# Patient Record
Sex: Female | Born: 1955 | Race: Black or African American | Hispanic: No | Marital: Single | Smoking: Former smoker
Health system: Southern US, Community
[De-identification: ages and names within clinical notes are randomized; demographics above are authoritative.]

## PROBLEM LIST (undated history)

## (undated) DIAGNOSIS — B182 Chronic viral hepatitis C: Secondary | ICD-10-CM

## (undated) DIAGNOSIS — F329 Major depressive disorder, single episode, unspecified: Secondary | ICD-10-CM

## (undated) DIAGNOSIS — I7090 Unspecified atherosclerosis: Secondary | ICD-10-CM

## (undated) DIAGNOSIS — I10 Essential (primary) hypertension: Secondary | ICD-10-CM

## (undated) DIAGNOSIS — D649 Anemia, unspecified: Secondary | ICD-10-CM

## (undated) DIAGNOSIS — F1910 Other psychoactive substance abuse, uncomplicated: Secondary | ICD-10-CM

## (undated) DIAGNOSIS — K7460 Unspecified cirrhosis of liver: Secondary | ICD-10-CM

## (undated) DIAGNOSIS — B1920 Unspecified viral hepatitis C without hepatic coma: Secondary | ICD-10-CM

## (undated) DIAGNOSIS — F419 Anxiety disorder, unspecified: Secondary | ICD-10-CM

## (undated) DIAGNOSIS — S82899A Other fracture of unspecified lower leg, initial encounter for closed fracture: Secondary | ICD-10-CM

## (undated) DIAGNOSIS — F32A Depression, unspecified: Secondary | ICD-10-CM

## (undated) DIAGNOSIS — M1990 Unspecified osteoarthritis, unspecified site: Secondary | ICD-10-CM

## (undated) DIAGNOSIS — R002 Palpitations: Secondary | ICD-10-CM

## (undated) DIAGNOSIS — E1140 Type 2 diabetes mellitus with diabetic neuropathy, unspecified: Secondary | ICD-10-CM

## (undated) DIAGNOSIS — J40 Bronchitis, not specified as acute or chronic: Secondary | ICD-10-CM

## (undated) DIAGNOSIS — K219 Gastro-esophageal reflux disease without esophagitis: Secondary | ICD-10-CM

## (undated) HISTORY — PX: TUBAL LIGATION: SHX77

## (undated) HISTORY — DX: Chronic viral hepatitis C: B18.2

## (undated) HISTORY — DX: Unspecified atherosclerosis: I70.90

## (undated) HISTORY — DX: Anemia, unspecified: D64.9

## (undated) HISTORY — DX: Other psychoactive substance abuse, uncomplicated: F19.10

## (undated) HISTORY — DX: Other fracture of unspecified lower leg, initial encounter for closed fracture: S82.899A

## (undated) HISTORY — DX: Unspecified cirrhosis of liver: K74.60

## (undated) HISTORY — DX: Unspecified viral hepatitis C without hepatic coma: B19.20

## (undated) HISTORY — PX: CERVICAL BIOPSY: SHX590

## (undated) HISTORY — PX: TONSILLECTOMY: SUR1361

---

## 1998-07-17 ENCOUNTER — Inpatient Hospital Stay (HOSPITAL_COMMUNITY): Admission: AD | Admit: 1998-07-17 | Discharge: 1998-07-22 | Payer: Self-pay | Admitting: *Deleted

## 1998-11-08 ENCOUNTER — Emergency Department (HOSPITAL_COMMUNITY): Admission: EM | Admit: 1998-11-08 | Discharge: 1998-11-08 | Payer: Self-pay | Admitting: Emergency Medicine

## 1998-11-09 ENCOUNTER — Ambulatory Visit (HOSPITAL_COMMUNITY): Admission: RE | Admit: 1998-11-09 | Discharge: 1998-11-09 | Payer: Self-pay | Admitting: Emergency Medicine

## 1998-11-09 ENCOUNTER — Encounter: Payer: Self-pay | Admitting: Emergency Medicine

## 1998-11-09 ENCOUNTER — Emergency Department (HOSPITAL_COMMUNITY): Admission: EM | Admit: 1998-11-09 | Discharge: 1998-11-09 | Payer: Self-pay | Admitting: Emergency Medicine

## 1998-11-19 ENCOUNTER — Other Ambulatory Visit: Admission: RE | Admit: 1998-11-19 | Discharge: 1998-11-19 | Payer: Self-pay | Admitting: *Deleted

## 1998-11-19 ENCOUNTER — Encounter: Admission: RE | Admit: 1998-11-19 | Discharge: 1998-11-19 | Payer: Self-pay | Admitting: Obstetrics

## 1999-02-01 ENCOUNTER — Encounter: Payer: Self-pay | Admitting: Internal Medicine

## 1999-02-01 ENCOUNTER — Ambulatory Visit (HOSPITAL_COMMUNITY): Admission: RE | Admit: 1999-02-01 | Discharge: 1999-02-01 | Payer: Self-pay | Admitting: Internal Medicine

## 1999-02-04 ENCOUNTER — Emergency Department (HOSPITAL_COMMUNITY): Admission: EM | Admit: 1999-02-04 | Discharge: 1999-02-04 | Payer: Self-pay

## 1999-02-09 ENCOUNTER — Encounter: Admission: RE | Admit: 1999-02-09 | Discharge: 1999-02-09 | Payer: Self-pay | Admitting: Obstetrics & Gynecology

## 2004-03-04 ENCOUNTER — Ambulatory Visit: Payer: Self-pay | Admitting: Family Medicine

## 2004-03-16 ENCOUNTER — Ambulatory Visit: Payer: Self-pay | Admitting: Family Medicine

## 2004-03-19 ENCOUNTER — Ambulatory Visit: Payer: Self-pay | Admitting: *Deleted

## 2004-04-05 ENCOUNTER — Ambulatory Visit: Payer: Self-pay | Admitting: Family Medicine

## 2004-06-01 ENCOUNTER — Ambulatory Visit: Payer: Self-pay | Admitting: Family Medicine

## 2004-06-19 ENCOUNTER — Emergency Department (HOSPITAL_COMMUNITY): Admission: EM | Admit: 2004-06-19 | Discharge: 2004-06-19 | Payer: Self-pay | Admitting: Emergency Medicine

## 2004-06-21 ENCOUNTER — Ambulatory Visit: Payer: Self-pay | Admitting: Family Medicine

## 2004-06-21 LAB — CONVERTED CEMR LAB: Pap Smear: NORMAL

## 2004-06-30 ENCOUNTER — Ambulatory Visit (HOSPITAL_COMMUNITY): Admission: RE | Admit: 2004-06-30 | Discharge: 2004-06-30 | Payer: Self-pay | Admitting: Family Medicine

## 2004-08-09 ENCOUNTER — Ambulatory Visit: Payer: Self-pay | Admitting: Family Medicine

## 2004-10-04 ENCOUNTER — Ambulatory Visit: Payer: Self-pay | Admitting: Family Medicine

## 2005-01-05 ENCOUNTER — Ambulatory Visit: Payer: Self-pay | Admitting: Internal Medicine

## 2005-01-13 ENCOUNTER — Ambulatory Visit: Payer: Self-pay | Admitting: Internal Medicine

## 2005-05-20 ENCOUNTER — Ambulatory Visit: Payer: Self-pay | Admitting: Family Medicine

## 2005-05-24 ENCOUNTER — Ambulatory Visit (HOSPITAL_COMMUNITY): Admission: RE | Admit: 2005-05-24 | Discharge: 2005-05-24 | Payer: Self-pay | Admitting: Internal Medicine

## 2005-06-29 ENCOUNTER — Ambulatory Visit: Payer: Self-pay | Admitting: Family Medicine

## 2005-10-21 ENCOUNTER — Ambulatory Visit: Payer: Self-pay | Admitting: Family Medicine

## 2005-11-09 IMAGING — CR DG LUMBAR SPINE COMPLETE 4+V
5 series · 5 of 5 positions shown · non-contrast
Comparison: none

CLINICAL DATA: Chronic lower back pain.  
LUMBOSACRAL SPINE SERIES:
There are five lumbar type vertebral bodies identified.  Vertebral body heights are maintained.  There is mild disc space narrowing identified at the L3-4 level with associate anterior osteophytosis at L3-4 level compatible with mild degenerative change.  Bony alignment appears intact.  No evidence for spondylosis or spondylolisthesis is seen.  No acute bony abnormalities are noted.  Incidental note is made of floculant calcification in the right aspect of the pelvis compatible with fibroid calcification.

[view not recorded (1 of 5)]
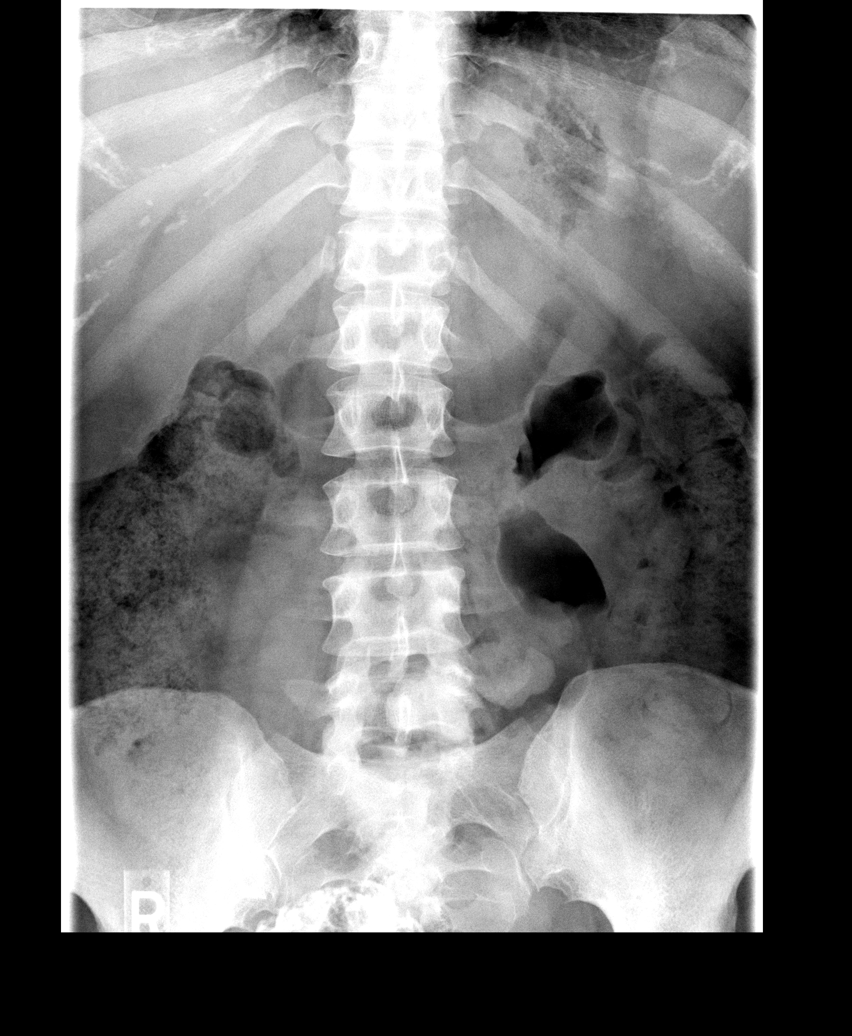

[view not recorded (2 of 5)]
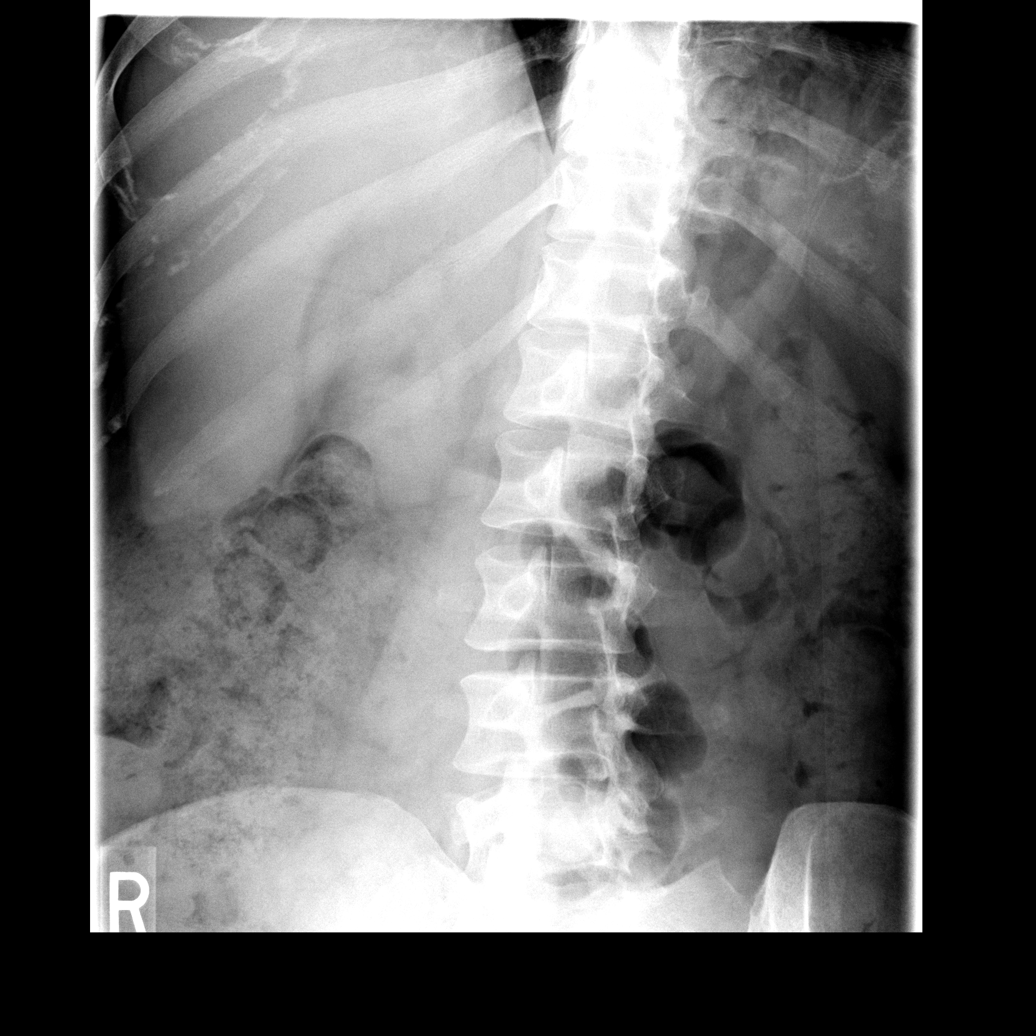

[view not recorded (3 of 5)]
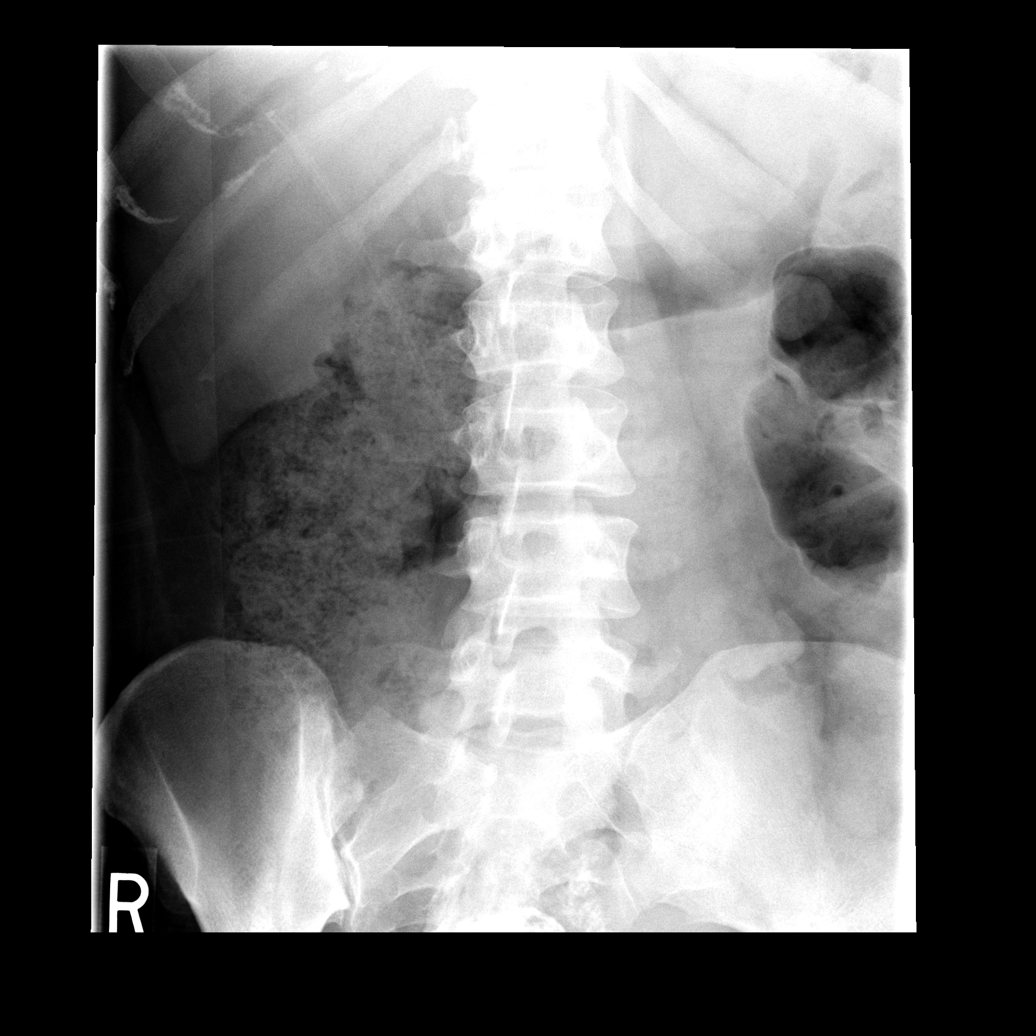

[view not recorded (4 of 5)]
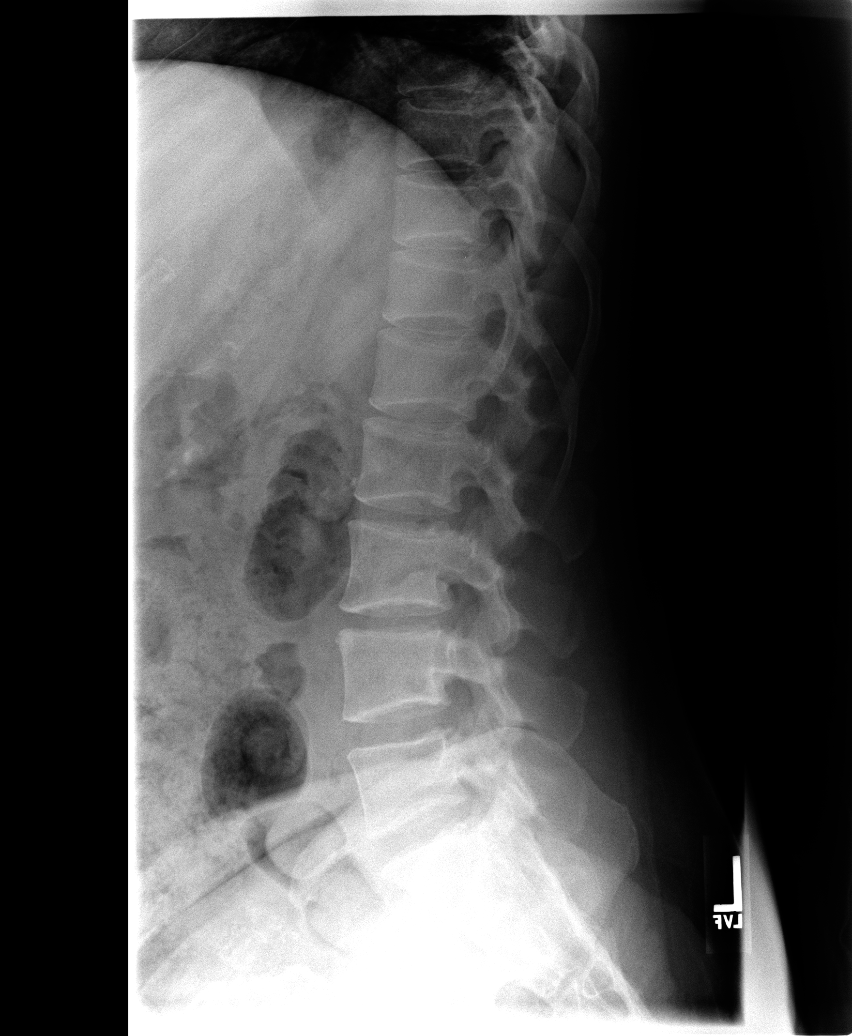

[view not recorded (5 of 5)]
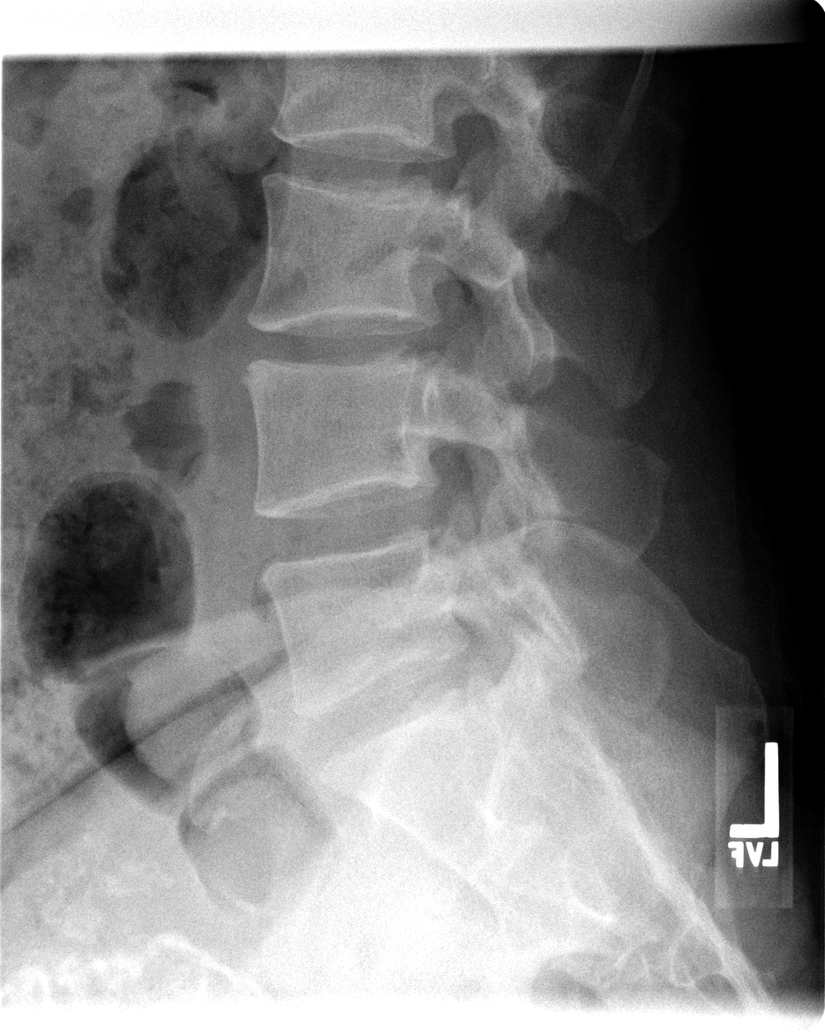

[5 of 5 positions shown; findings below may reference images not displayed]

IMPRESSION: Findings compatible with mild degenerative changes as described above.  Fibroid calcification.

## 2005-11-22 ENCOUNTER — Ambulatory Visit: Payer: Self-pay | Admitting: Internal Medicine

## 2005-12-02 ENCOUNTER — Emergency Department (HOSPITAL_COMMUNITY): Admission: EM | Admit: 2005-12-02 | Discharge: 2005-12-02 | Payer: Self-pay | Admitting: Emergency Medicine

## 2005-12-16 ENCOUNTER — Ambulatory Visit: Payer: Self-pay | Admitting: Family Medicine

## 2005-12-21 ENCOUNTER — Ambulatory Visit: Payer: Self-pay | Admitting: Family Medicine

## 2006-03-14 ENCOUNTER — Ambulatory Visit: Payer: Self-pay | Admitting: Internal Medicine

## 2006-05-04 ENCOUNTER — Emergency Department (HOSPITAL_COMMUNITY): Admission: EM | Admit: 2006-05-04 | Discharge: 2006-05-05 | Payer: Self-pay | Admitting: Emergency Medicine

## 2006-05-29 ENCOUNTER — Ambulatory Visit: Payer: Self-pay | Admitting: Family Medicine

## 2006-09-21 ENCOUNTER — Ambulatory Visit: Payer: Self-pay | Admitting: Family Medicine

## 2006-10-23 ENCOUNTER — Ambulatory Visit: Payer: Self-pay | Admitting: Family Medicine

## 2006-11-14 ENCOUNTER — Ambulatory Visit: Payer: Self-pay | Admitting: Family Medicine

## 2006-12-26 ENCOUNTER — Encounter (INDEPENDENT_AMBULATORY_CARE_PROVIDER_SITE_OTHER): Payer: Self-pay | Admitting: Family Medicine

## 2006-12-26 DIAGNOSIS — G609 Hereditary and idiopathic neuropathy, unspecified: Secondary | ICD-10-CM | POA: Insufficient documentation

## 2006-12-26 DIAGNOSIS — B1710 Acute hepatitis C without hepatic coma: Secondary | ICD-10-CM

## 2006-12-26 DIAGNOSIS — I10 Essential (primary) hypertension: Secondary | ICD-10-CM | POA: Insufficient documentation

## 2006-12-26 DIAGNOSIS — E119 Type 2 diabetes mellitus without complications: Secondary | ICD-10-CM

## 2007-01-04 ENCOUNTER — Ambulatory Visit: Payer: Self-pay | Admitting: Internal Medicine

## 2007-02-15 ENCOUNTER — Encounter (INDEPENDENT_AMBULATORY_CARE_PROVIDER_SITE_OTHER): Payer: Self-pay | Admitting: Internal Medicine

## 2007-02-15 LAB — CONVERTED CEMR LAB
ALT: 15 units/L (ref 0–35)
AST: 18 units/L (ref 0–37)
CO2: 22 meq/L (ref 19–32)
Calcium: 9 mg/dL (ref 8.4–10.5)
Chloride: 108 meq/L (ref 96–112)
Cholesterol: 204 mg/dL — ABNORMAL HIGH (ref 0–200)
Creatinine, Ser: 1.01 mg/dL (ref 0.40–1.20)
Potassium: 3.4 meq/L — ABNORMAL LOW (ref 3.5–5.3)
Sodium: 143 meq/L (ref 135–145)
Total CHOL/HDL Ratio: 5
Total Protein: 7.7 g/dL (ref 6.0–8.3)

## 2007-02-28 ENCOUNTER — Encounter (INDEPENDENT_AMBULATORY_CARE_PROVIDER_SITE_OTHER): Payer: Self-pay | Admitting: *Deleted

## 2007-08-24 ENCOUNTER — Encounter: Admission: RE | Admit: 2007-08-24 | Discharge: 2007-08-24 | Payer: Self-pay | Admitting: Gastroenterology

## 2007-11-08 ENCOUNTER — Ambulatory Visit: Payer: Self-pay | Admitting: Internal Medicine

## 2007-12-04 ENCOUNTER — Encounter (INDEPENDENT_AMBULATORY_CARE_PROVIDER_SITE_OTHER): Payer: Self-pay | Admitting: Family Medicine

## 2007-12-04 ENCOUNTER — Ambulatory Visit: Payer: Self-pay | Admitting: Internal Medicine

## 2007-12-04 ENCOUNTER — Encounter: Payer: Self-pay | Admitting: Family Medicine

## 2007-12-04 LAB — CONVERTED CEMR LAB
AST: 60 units/L — ABNORMAL HIGH (ref 0–37)
Albumin: 3.9 g/dL (ref 3.5–5.2)
Alkaline Phosphatase: 64 units/L (ref 39–117)
Calcium: 9.3 mg/dL (ref 8.4–10.5)
Chloride: 109 meq/L (ref 96–112)
Glucose, Bld: 82 mg/dL (ref 70–99)
LDL Cholesterol: 73 mg/dL (ref 0–99)
Potassium: 4.1 meq/L (ref 3.5–5.3)
Sodium: 143 meq/L (ref 135–145)
Total Protein: 7.2 g/dL (ref 6.0–8.3)

## 2008-01-15 ENCOUNTER — Ambulatory Visit: Payer: Self-pay | Admitting: Internal Medicine

## 2008-03-10 ENCOUNTER — Ambulatory Visit: Payer: Self-pay | Admitting: Internal Medicine

## 2008-03-11 ENCOUNTER — Ambulatory Visit: Payer: Self-pay | Admitting: Internal Medicine

## 2008-06-11 ENCOUNTER — Ambulatory Visit: Payer: Self-pay | Admitting: Internal Medicine

## 2008-10-21 ENCOUNTER — Emergency Department (HOSPITAL_COMMUNITY): Admission: EM | Admit: 2008-10-21 | Discharge: 2008-10-21 | Payer: Self-pay | Admitting: Emergency Medicine

## 2009-01-25 ENCOUNTER — Emergency Department (HOSPITAL_COMMUNITY): Admission: EM | Admit: 2009-01-25 | Discharge: 2009-01-25 | Payer: Self-pay | Admitting: Emergency Medicine

## 2009-01-27 ENCOUNTER — Ambulatory Visit: Payer: Self-pay | Admitting: Internal Medicine

## 2009-01-27 ENCOUNTER — Encounter (INDEPENDENT_AMBULATORY_CARE_PROVIDER_SITE_OTHER): Payer: Self-pay | Admitting: Adult Health

## 2009-01-27 LAB — CONVERTED CEMR LAB
AST: 53 units/L — ABNORMAL HIGH (ref 0–37)
Albumin: 3.8 g/dL (ref 3.5–5.2)
BUN: 10 mg/dL (ref 6–23)
CO2: 22 meq/L (ref 19–32)
Calcium: 9.1 mg/dL (ref 8.4–10.5)
Chloride: 106 meq/L (ref 96–112)
Cholesterol: 137 mg/dL (ref 0–200)
Eosinophils Relative: 1 % (ref 0–5)
Glucose, Bld: 128 mg/dL — ABNORMAL HIGH (ref 70–99)
HCT: 39.5 % (ref 36.0–46.0)
HDL: 32 mg/dL — ABNORMAL LOW (ref >39)
Hemoglobin: 12.8 g/dL (ref 12.0–15.0)
Lymphocytes Relative: 38 % (ref 12–46)
Lymphs Abs: 1.6 10*3/uL (ref 0.7–4.0)
MCV: 81.8 fL (ref 78.0–100.0)
Monocytes Absolute: 0.5 10*3/uL (ref 0.1–1.0)
Monocytes Relative: 11 % (ref 3–12)
Potassium: 3.8 meq/L (ref 3.5–5.3)
TSH: 0.888 microintl units/mL (ref 0.350–4.500)
Triglycerides: 150 mg/dL — ABNORMAL HIGH (ref <150)
WBC: 4.1 10*3/uL (ref 4.0–10.5)

## 2009-02-25 ENCOUNTER — Ambulatory Visit: Payer: Self-pay | Admitting: Family Medicine

## 2009-02-25 LAB — CONVERTED CEMR LAB
Basophils Relative: 0 % (ref 0–1)
Hemoglobin: 12.1 g/dL (ref 12.0–15.0)
MCHC: 31.1 g/dL (ref 30.0–36.0)
Monocytes Absolute: 0.5 10*3/uL (ref 0.1–1.0)
Monocytes Relative: 15 % — ABNORMAL HIGH (ref 3–12)
Neutro Abs: 1.4 10*3/uL — ABNORMAL LOW (ref 1.7–7.7)
RBC: 4.6 M/uL (ref 3.87–5.11)

## 2010-07-04 ENCOUNTER — Encounter: Payer: Self-pay | Admitting: Family Medicine

## 2010-09-21 LAB — GLUCOSE, CAPILLARY: Glucose-Capillary: 114 mg/dL — ABNORMAL HIGH (ref 70–99)

## 2010-09-30 ENCOUNTER — Ambulatory Visit (INDEPENDENT_AMBULATORY_CARE_PROVIDER_SITE_OTHER): Payer: Self-pay

## 2010-09-30 ENCOUNTER — Inpatient Hospital Stay (INDEPENDENT_AMBULATORY_CARE_PROVIDER_SITE_OTHER)
Admission: RE | Admit: 2010-09-30 | Discharge: 2010-09-30 | Disposition: A | Discharge status: Home / Self Care | Payer: Self-pay | Source: Ambulatory Visit | Attending: Family Medicine | Admitting: Family Medicine

## 2010-09-30 DIAGNOSIS — R071 Chest pain on breathing: Secondary | ICD-10-CM

## 2010-09-30 DIAGNOSIS — I10 Essential (primary) hypertension: Secondary | ICD-10-CM

## 2010-09-30 LAB — POCT I-STAT, CHEM 8
Calcium, Ion: 1.17 mmol/L (ref 1.12–1.32)
Glucose, Bld: 218 mg/dL — ABNORMAL HIGH (ref 70–99)
HCT: 44 % (ref 36.0–46.0)
Hemoglobin: 15 g/dL (ref 12.0–15.0)

## 2010-09-30 LAB — POCT URINALYSIS DIP (DEVICE)
Nitrite: NEGATIVE
Protein, ur: 100 mg/dL — AB
Urobilinogen, UA: 2 mg/dL — ABNORMAL HIGH (ref 0.0–1.0)
pH: 5 (ref 5.0–8.0)

## 2010-09-30 LAB — GLUCOSE, CAPILLARY: Glucose-Capillary: 229 mg/dL — ABNORMAL HIGH (ref 70–99)

## 2010-10-05 ENCOUNTER — Inpatient Hospital Stay (INDEPENDENT_AMBULATORY_CARE_PROVIDER_SITE_OTHER)
Admission: RE | Admit: 2010-10-05 | Discharge: 2010-10-05 | Disposition: A | Discharge status: Home / Self Care | Payer: Self-pay | Source: Ambulatory Visit | Attending: Family Medicine | Admitting: Family Medicine

## 2010-10-05 DIAGNOSIS — IMO0002 Reserved for concepts with insufficient information to code with codable children: Secondary | ICD-10-CM

## 2010-10-05 DIAGNOSIS — J40 Bronchitis, not specified as acute or chronic: Secondary | ICD-10-CM

## 2010-10-06 ENCOUNTER — Emergency Department (HOSPITAL_COMMUNITY)
Admission: EM | Admit: 2010-10-06 | Discharge: 2010-10-06 | Disposition: A | Discharge status: Home / Self Care | Payer: Self-pay | Attending: Emergency Medicine | Admitting: Emergency Medicine

## 2010-10-06 DIAGNOSIS — I10 Essential (primary) hypertension: Secondary | ICD-10-CM | POA: Insufficient documentation

## 2010-10-06 DIAGNOSIS — E119 Type 2 diabetes mellitus without complications: Secondary | ICD-10-CM | POA: Insufficient documentation

## 2010-10-06 DIAGNOSIS — S0920XA Traumatic rupture of unspecified ear drum, initial encounter: Secondary | ICD-10-CM | POA: Insufficient documentation

## 2010-10-06 DIAGNOSIS — IMO0002 Reserved for concepts with insufficient information to code with codable children: Secondary | ICD-10-CM | POA: Insufficient documentation

## 2010-12-12 ENCOUNTER — Emergency Department (HOSPITAL_COMMUNITY)
Admission: EM | Admit: 2010-12-12 | Discharge: 2010-12-13 | Disposition: A | Discharge status: Home / Self Care | Payer: Self-pay | Attending: Emergency Medicine | Admitting: Emergency Medicine

## 2010-12-12 ENCOUNTER — Emergency Department (HOSPITAL_COMMUNITY): Payer: Self-pay

## 2010-12-12 DIAGNOSIS — I10 Essential (primary) hypertension: Secondary | ICD-10-CM | POA: Insufficient documentation

## 2010-12-12 DIAGNOSIS — Z794 Long term (current) use of insulin: Secondary | ICD-10-CM | POA: Insufficient documentation

## 2010-12-12 DIAGNOSIS — E119 Type 2 diabetes mellitus without complications: Secondary | ICD-10-CM | POA: Insufficient documentation

## 2010-12-12 DIAGNOSIS — M25559 Pain in unspecified hip: Secondary | ICD-10-CM | POA: Insufficient documentation

## 2010-12-12 DIAGNOSIS — W010XXA Fall on same level from slipping, tripping and stumbling without subsequent striking against object, initial encounter: Secondary | ICD-10-CM | POA: Insufficient documentation

## 2010-12-12 DIAGNOSIS — M79609 Pain in unspecified limb: Secondary | ICD-10-CM | POA: Insufficient documentation

## 2011-09-22 ENCOUNTER — Emergency Department (INDEPENDENT_AMBULATORY_CARE_PROVIDER_SITE_OTHER)
Admission: EM | Admit: 2011-09-22 | Discharge: 2011-09-22 | Disposition: A | Discharge status: Home / Self Care | Payer: Self-pay | Source: Home / Self Care | Attending: Emergency Medicine | Admitting: Emergency Medicine

## 2011-09-22 ENCOUNTER — Encounter (HOSPITAL_COMMUNITY): Payer: Self-pay | Admitting: Emergency Medicine

## 2011-09-22 ENCOUNTER — Emergency Department (INDEPENDENT_AMBULATORY_CARE_PROVIDER_SITE_OTHER): Payer: Self-pay

## 2011-09-22 DIAGNOSIS — S9030XA Contusion of unspecified foot, initial encounter: Secondary | ICD-10-CM

## 2011-09-22 HISTORY — DX: Essential (primary) hypertension: I10

## 2011-09-22 MED ORDER — ACETAMINOPHEN-CODEINE #3 300-30 MG PO TABS: 1.0000 | ORAL_TABLET | Freq: Four times a day (QID) | ORAL | Status: AC | PRN

## 2011-09-22 MED ORDER — MELOXICAM 7.5 MG PO TABS: 7.5000 mg | ORAL_TABLET | Freq: Every day | ORAL | Status: DC

## 2011-09-22 NOTE — ED Provider Notes (Signed)
History     CSN: [id number]  Arrival date & time 09/22/11  1924   First MD Initiated Contact with Patient 09/22/11 1925      No chief complaint on file.   (Consider location/radiation/quality/duration/timing/severity/associated sxs/prior treatment) HPI Comments: Today at home a package of frozen chicken fell on top of her left foot patient presents urgent care tonight with pain in a minimally superficial abrasion in her second toe dorsal aspect with minimal bleeding. Pain with walking and touching second third toes and mid foot area.  Patient denies any numbness, tingling.  The history is provided by the patient.    No past medical history on file.  No past surgical history on file.  No family history on file.  History  Substance Use Topics  . Smoking status: Not on file  . Smokeless tobacco: Not on file  . Alcohol Use: Not on file    OB History    No data available      Review of Systems  Constitutional: Negative for fever and chills.  Skin: Positive for wound. Negative for rash.    Allergies  Review of patient's allergies indicates not on file.  Home Medications  No current outpatient prescriptions on file.  BP 165/90  Pulse 90  Temp(Src) 98.6 F (37 C) (Oral)  Resp 20  SpO2 98%  Physical Exam  Nursing note and vitals reviewed. Constitutional: She appears well-developed and well-nourished.  Musculoskeletal: She exhibits tenderness. She exhibits no edema.       Left ankle: She exhibits normal range of motion, no swelling and no laceration. tenderness. No lateral malleolus and no medial malleolus tenderness found.       Feet:  Neurological: She is alert.  Skin: Skin is warm. No rash noted. There is erythema.    ED Course  Procedures (including critical care time)  Labs Reviewed - No data to display Dg Foot Complete Left  09/22/2011  *RADIOLOGY REPORT*  Clinical Data: Foot pain.  Foot trauma.  LEFT FOOT - COMPLETE 3+ VIEW  Comparison: None.   Findings: There is no fracture identified.  The soft tissues appear within normal limits.  There does appear to be swelling of the fourth and fifth toes.  Calcaneal spur incidentally noted.  IMPRESSION: No acute osseous abnormality.  Original Report Authenticated By: Andreas Newport, M.D.     No diagnosis found.    MDM  Left foot contusion        Jimmie Molly, MD 09/22/11 2102

## 2011-09-22 NOTE — ED Notes (Signed)
Frozen chicken fell out of freezer, landing on left foot.  Small puncture to toe on left foot.

## 2011-09-22 NOTE — Discharge Instructions (Signed)
As discussed you did not sustain any fracture or any dislocations of your midfoot or toes. Can use his medicines to control your pain and discomfort. Today apply ice packs for 10-15 minutes at a time and keep the foot elevated. If pain persists beyond 7-10 days should followup with the orthopedic provider    Contusion A contusion is a deep bruise. Contusions happen when an injury causes bleeding under the skin. Signs of bruising include pain, puffiness (swelling), and discolored skin. The contusion may turn blue, purple, or yellow. HOME CARE   Put ice on the injured area.   Put ice in a plastic bag.   Place a towel between your skin and the bag.   Leave the ice on for 15 to 20 minutes, 3 to 4 times a day.   Only take medicine as told by your doctor.   Rest the injured area.   If possible, raise (elevate) the injured area to lessen puffiness.  GET HELP RIGHT AWAY IF:   You have more bruising or puffiness.   You have pain that is getting worse.   Your puffiness or pain is not helped by medicine.  MAKE SURE YOU:   Understand these instructions.   Will watch your condition.   Will get help right away if you are not doing well or get worse.  Document Released: 11/16/2007 Document Revised: 05/19/2011 Document Reviewed: 04/04/2011 [facility] Patient Information 2012 [place], [place].Contusion A contusion is a deep bruise. Contusions are the result of an injury that caused bleeding under the skin. The contusion may turn blue, purple, or yellow. Minor injuries will give you a painless contusion, but more severe contusions may stay painful and swollen for a few weeks.  CAUSES  A contusion is usually caused by a blow, trauma, or direct force to an area of the body. SYMPTOMS   Swelling and redness of the injured area.   Bruising of the injured area.   Tenderness and soreness of the injured area.   Pain.  DIAGNOSIS  The diagnosis can be made by taking a history and physical exam.  An X-ray, CT scan, or MRI may be needed to determine if there were any associated injuries, such as fractures. TREATMENT  Specific treatment will depend on what area of the body was injured. In general, the best treatment for a contusion is resting, icing, elevating, and applying cold compresses to the injured area. Over-the-counter medicines may also be recommended for pain control. Ask your caregiver what the best treatment is for your contusion. HOME CARE INSTRUCTIONS   Put ice on the injured area.   Put ice in a plastic bag.   Place a towel between your skin and the bag.   Leave the ice on for 15 to 20 minutes, 3 to 4 times a day.   Only take over-the-counter or prescription medicines for pain, discomfort, or fever as directed by your caregiver. Your caregiver may recommend avoiding anti-inflammatory medicines (aspirin, ibuprofen, and naproxen) for 48 hours because these medicines may increase bruising.   Rest the injured area.   If possible, elevate the injured area to reduce swelling.  SEEK IMMEDIATE MEDICAL CARE IF:   You have increased bruising or swelling.   You have pain that is getting worse.   Your swelling or pain is not relieved with medicines.  MAKE SURE YOU:   Understand these instructions.   Will watch your condition.   Will get help right away if you are not doing well or get  worse.  Document Released: 03/09/2005 Document Revised: 05/19/2011 Document Reviewed: 04/04/2011 [facility] Patient Information 2012 [place], [place].

## 2011-12-20 ENCOUNTER — Other Ambulatory Visit (HOSPITAL_COMMUNITY): Payer: Self-pay | Admitting: Family Medicine

## 2011-12-20 ENCOUNTER — Ambulatory Visit: Payer: Self-pay | Admitting: Physical Therapy

## 2011-12-20 ENCOUNTER — Ambulatory Visit (HOSPITAL_COMMUNITY)
Admission: RE | Admit: 2011-12-20 | Discharge: 2011-12-20 | Disposition: A | Discharge status: Home / Self Care | Payer: Self-pay | Source: Ambulatory Visit | Attending: Family Medicine | Admitting: Family Medicine

## 2011-12-20 DIAGNOSIS — M25519 Pain in unspecified shoulder: Secondary | ICD-10-CM | POA: Insufficient documentation

## 2011-12-20 DIAGNOSIS — R52 Pain, unspecified: Secondary | ICD-10-CM

## 2011-12-22 ENCOUNTER — Ambulatory Visit: Payer: Self-pay | Attending: Family Medicine

## 2011-12-22 DIAGNOSIS — M25519 Pain in unspecified shoulder: Secondary | ICD-10-CM | POA: Insufficient documentation

## 2011-12-22 DIAGNOSIS — IMO0001 Reserved for inherently not codable concepts without codable children: Secondary | ICD-10-CM | POA: Insufficient documentation

## 2011-12-22 DIAGNOSIS — M25619 Stiffness of unspecified shoulder, not elsewhere classified: Secondary | ICD-10-CM | POA: Insufficient documentation

## 2011-12-30 ENCOUNTER — Ambulatory Visit: Payer: Self-pay | Admitting: Rehabilitation

## 2012-01-05 ENCOUNTER — Ambulatory Visit: Payer: Self-pay | Admitting: Rehabilitation

## 2012-03-02 ENCOUNTER — Encounter (HOSPITAL_COMMUNITY): Payer: Self-pay

## 2012-03-02 ENCOUNTER — Emergency Department (INDEPENDENT_AMBULATORY_CARE_PROVIDER_SITE_OTHER)
Admission: EM | Admit: 2012-03-02 | Discharge: 2012-03-02 | Disposition: A | Discharge status: Home / Self Care | Payer: Self-pay | Source: Home / Self Care | Attending: Emergency Medicine | Admitting: Emergency Medicine

## 2012-03-02 DIAGNOSIS — N390 Urinary tract infection, site not specified: Secondary | ICD-10-CM

## 2012-03-02 DIAGNOSIS — E1165 Type 2 diabetes mellitus with hyperglycemia: Secondary | ICD-10-CM

## 2012-03-02 LAB — POCT I-STAT, CHEM 8
Calcium, Ion: 1.24 mmol/L — ABNORMAL HIGH (ref 1.12–1.23)
Glucose, Bld: 297 mg/dL — ABNORMAL HIGH (ref 70–99)
HCT: 42 % (ref 36.0–46.0)
Hemoglobin: 14.3 g/dL (ref 12.0–15.0)
Potassium: 3.9 mEq/L (ref 3.5–5.1)

## 2012-03-02 LAB — POCT URINALYSIS DIP (DEVICE)
Glucose, UA: >=1000 mg/dL — AB
Specific Gravity, Urine: 1.01 (ref 1.005–1.030)
Urobilinogen, UA: 0.2 mg/dL (ref 0.0–1.0)
pH: 5.5 (ref 5.0–8.0)

## 2012-03-02 MED ORDER — INSULIN GLARGINE 100 UNIT/ML ~~LOC~~ SOLN: 35.0000 [IU] | Freq: Every day | SUBCUTANEOUS | Status: DC

## 2012-03-02 MED ORDER — CEPHALEXIN 500 MG PO CAPS: 500.0000 mg | ORAL_CAPSULE | Freq: Three times a day (TID) | ORAL | Status: DC

## 2012-03-02 NOTE — ED Provider Notes (Signed)
History     CSN: [id number]  Arrival date & time 03/02/12  1446   First MD Initiated Contact with Patient 03/02/12 1510      Chief Complaint  Patient presents with  . Abdominal Pain    (Consider location/radiation/quality/duration/timing/severity/associated sxs/prior treatment) HPI Comments: Patient presents to urgent care complaining of abdominal pain for about a week (patient points to infraumbilical region) associated with nausea. She has been feeling somewhat tired and lethargic. Denies any vomiting, diarrhea, no fevers. She also describes it her sugars have been running high as she is no longer using Lantus for the last 2 months since the closing of her clinic ([facility]), describes her last hemoglobin A1c was around 10. She describes increased urinary frequency and some discomfort in her lower abdomen.  Patient is a 56 y.o. female presenting with abdominal pain. The history is provided by the patient.  Abdominal Pain The primary symptoms of the illness include abdominal pain. The primary symptoms of the illness do not include fever, fatigue, shortness of breath, nausea, vomiting, diarrhea, hematemesis, hematochezia, dysuria, vaginal discharge or vaginal bleeding. The current episode started more than 2 days ago. The onset of the illness was sudden. The problem has not changed since onset. The patient states that she believes she is currently not pregnant. The patient has not had a change in bowel habit. Additional symptoms associated with the illness include urgency and frequency. Symptoms associated with the illness do not include chills or back pain. Significant associated medical issues do not include PUD, gallstones, diverticulitis or cardiac disease.    Past Medical History  Diagnosis Date  . Diabetes mellitus   . Hypertension     Past Surgical History  Procedure Date  . Tonsillectomy   . Tubal ligation     History reviewed. No pertinent family history.  History    Substance Use Topics  . Smoking status: Never Smoker   . Smokeless tobacco: Not on file  . Alcohol Use: No    OB History    Grav Para Term Preterm Abortions TAB SAB Ect Mult Living                  Review of Systems  Constitutional: Positive for activity change. Negative for fever, chills and fatigue.  Respiratory: Negative for cough and shortness of breath.   Gastrointestinal: Positive for abdominal pain. Negative for nausea, vomiting, diarrhea, hematochezia, abdominal distention, anal bleeding and hematemesis.  Genitourinary: Positive for urgency and frequency. Negative for dysuria, flank pain, vaginal bleeding, vaginal discharge, enuresis and vaginal pain.  Musculoskeletal: Negative for back pain.  Skin: Negative for rash.  Neurological: Negative for dizziness.    Allergies  Review of patient's allergies indicates no known allergies.  Home Medications   Current Outpatient Rx  Name Route Sig Dispense Refill  . LISINOPRIL-HYDROCHLOROTHIAZIDE 10-12.5 MG PO TABS Oral Take 1 tablet by mouth daily.    . MELOXICAM 7.5 MG PO TABS Oral Take 1 tablet (7.5 mg total) by mouth daily. 14 tablet 0  . METFORMIN HCL 500 MG PO TABS Oral Take 500 mg by mouth 2 (two) times daily with a meal.    . CEPHALEXIN 500 MG PO CAPS Oral Take 1 capsule (500 mg total) by mouth 3 (three) times daily. 30 capsule 0  . INSULIN GLARGINE 100 UNIT/ML [place] SOLN Subcutaneous Inject 35 Units into the skin at bedtime. 10 mL 12  . OVER THE COUNTER MEDICATION  Insulin, unable to give name of insulin  BP 93/54  Pulse 90  Temp 98 F (36.7 C) (Oral)  Resp 16  SpO2 98%  Physical Exam  Nursing note and vitals reviewed. Constitutional: Vital signs are normal. She appears well-developed and well-nourished.  Non-toxic appearance. She does not have a sickly appearance. She does not appear ill. No distress.  HENT:  Head: Normocephalic.  Eyes: Conjunctivae normal are normal.  Neck: Neck supple. No JVD present.   Cardiovascular: Normal rate and regular rhythm.   Pulmonary/Chest: Effort normal and breath sounds normal.  Abdominal: Soft. Bowel sounds are normal. She exhibits no distension and no mass. There is no hepatosplenomegaly or hepatomegaly. There is tenderness. There is no rigidity, no rebound, no guarding, no CVA tenderness, no tenderness at McBurney's point and negative Murphy's sign.    Neurological: She is alert.  Skin: No rash noted. No erythema.    ED Course  Procedures (including critical care time)  Labs Reviewed  POCT URINALYSIS DIP (DEVICE) - Abnormal; Notable for the following:    Glucose, UA >=1000 (*)     Hgb urine dipstick TRACE (*)     Protein, ur 30 (*)     Nitrite POSITIVE (*)     Leukocytes, UA SMALL (*)  Biochemical Testing Only. Please order routine urinalysis from main lab if confirmatory testing is needed.   All other components within normal limits  POCT I-STAT, CHEM 8 - Abnormal; Notable for the following:    Glucose, Bld 297 (*)     Calcium, Ion 1.24 (*)     All other components within normal limits  URINE CULTURE   No results found.   1. Diabetes mellitus out of control   2. Urinary tract infection       MDM  Patient with uncontrolled diabetes. Symptomatic of abnormal urine and suprapubic tenderness. Patient has been restarted on her Lantus at 35 units at night. Started on Keflex as well pending urine cultures. Patient has been otherwise about symptoms that should warrant further evaluation the emergency department. Initial blood pressure was noted to be abnormal repeat blood pressure noted a systolic blood pressure is 130's-70's. Patient agrees with treatment plan and followup care. She is making arrangements to followup with the family practice Center has an appointment Monday presumably for application.  Jimmie Molly, MD 03/02/12 1710

## 2012-03-02 NOTE — ED Notes (Signed)
C/o abdominal pain x 1 week, with nausea. States has been feeling lethargic and has not had her insulin in 2 mos due to [employer] closing. States that her normal BP runs 130's over 70's feels like today seems low, constant dull/aching pain

## 2012-03-06 ENCOUNTER — Telehealth (HOSPITAL_COMMUNITY): Payer: Self-pay | Admitting: *Deleted

## 2012-03-06 LAB — URINE CULTURE: Colony Count: >=100000

## 2012-03-06 NOTE — ED Notes (Signed)
Urine culture positive for Staphylococcus.  Order received from Dr. Artis Flock for Doxycycline 100mg  po bid x 20 tabs.  Pt called and made aware.  Instructed to stop Keflex.  Rx called to [facility] on [address].

## 2012-03-06 NOTE — ED Notes (Signed)
Patient reported to ucc asking questions in regards to medications.  Patient reports medicine says "for bronchitis".  Reviewed record and patient's medication bottle and pharmacy papers.  Discussed with patient the purpose of antibiotic- for uti.  Verified medicine and dosage.  Answered questions involving scheduling of medicine.

## 2012-05-23 ENCOUNTER — Emergency Department (HOSPITAL_COMMUNITY): Payer: Worker's Compensation

## 2012-05-23 ENCOUNTER — Emergency Department (HOSPITAL_COMMUNITY)
Admission: EM | Admit: 2012-05-23 | Discharge: 2012-05-23 | Disposition: A | Discharge status: Home / Self Care | Payer: Worker's Compensation | Attending: Emergency Medicine | Admitting: Emergency Medicine

## 2012-05-23 ENCOUNTER — Encounter (HOSPITAL_COMMUNITY): Payer: Self-pay | Admitting: Family Medicine

## 2012-05-23 DIAGNOSIS — S82409A Unspecified fracture of shaft of unspecified fibula, initial encounter for closed fracture: Secondary | ICD-10-CM

## 2012-05-23 DIAGNOSIS — Y990 Civilian activity done for income or pay: Secondary | ICD-10-CM | POA: Insufficient documentation

## 2012-05-23 DIAGNOSIS — Z794 Long term (current) use of insulin: Secondary | ICD-10-CM | POA: Insufficient documentation

## 2012-05-23 DIAGNOSIS — I10 Essential (primary) hypertension: Secondary | ICD-10-CM | POA: Insufficient documentation

## 2012-05-23 DIAGNOSIS — Y9289 Other specified places as the place of occurrence of the external cause: Secondary | ICD-10-CM | POA: Insufficient documentation

## 2012-05-23 DIAGNOSIS — Y9389 Activity, other specified: Secondary | ICD-10-CM | POA: Insufficient documentation

## 2012-05-23 DIAGNOSIS — E119 Type 2 diabetes mellitus without complications: Secondary | ICD-10-CM | POA: Insufficient documentation

## 2012-05-23 DIAGNOSIS — X500XXA Overexertion from strenuous movement or load, initial encounter: Secondary | ICD-10-CM | POA: Insufficient documentation

## 2012-05-23 DIAGNOSIS — Z79899 Other long term (current) drug therapy: Secondary | ICD-10-CM | POA: Insufficient documentation

## 2012-05-23 DIAGNOSIS — S82899A Other fracture of unspecified lower leg, initial encounter for closed fracture: Secondary | ICD-10-CM | POA: Insufficient documentation

## 2012-05-23 MED ORDER — HYDROCODONE-ACETAMINOPHEN 5-325 MG PO TABS: 1.0000 | ORAL_TABLET | Freq: Three times a day (TID) | ORAL | Status: DC | PRN

## 2012-05-23 MED ORDER — HYDROCODONE-ACETAMINOPHEN 5-325 MG PO TABS
1.0000 | ORAL_TABLET | Freq: Once | ORAL | Status: AC
  Administered 2012-05-23: 1 via ORAL
  Filled 2012-05-23: qty 1

## 2012-05-23 NOTE — ED Notes (Signed)
Ortho Tech completed tasks.

## 2012-05-23 NOTE — ED Notes (Signed)
Patient states that she was coming down the steps at work ([employer]) around 0400 and her foot slipped. States she heard something pop in her ankle. Since then, her ankle has been swelling and she is unable to bear weight.

## 2012-05-23 NOTE — Discharge Instructions (Signed)
As discussed, it is important that you follow up as soon as possible with the orthopedist for continued management of your condition.  If you develop any new, or concerning changes in your condition, please return to the emergency department immediately.

## 2012-05-23 NOTE — ED Notes (Addendum)
Patient transported to X-ray 

## 2012-05-23 NOTE — ED Notes (Signed)
Mistakenly "clicked" off Ortho tasks.  Waiting on Ortho tech.

## 2012-05-23 NOTE — ED Provider Notes (Signed)
History     CSN: [id number]  Arrival date & time 05/23/12  4098   First MD Initiated Contact with Patient 05/23/12 [phone]      Chief Complaint  Patient presents with  . Ankle Injury    (Consider location/radiation/quality/duration/timing/severity/associated sxs/prior treatment) HPI The patient presents with left ankle pain.  She states that approximately 3 hours ago, she slipped going down steps.  She felt a pop.  Since that time there's been pain persistently in the ankle the worse with weightbearing, which she is unable to do.  She has not taken any medication for her pain thus far.  She denies other trauma, complaints.  She states that she has been compliant with her medications, has been in her usual state of health. Past Medical History  Diagnosis Date  . Diabetes mellitus   . Hypertension     Past Surgical History  Procedure Date  . Tonsillectomy   . Tubal ligation     No family history on file.  History  Substance Use Topics  . Smoking status: Never Smoker   . Smokeless tobacco: Not on file  . Alcohol Use: No    OB History    Grav Para Term Preterm Abortions TAB SAB Ect Mult Living                  Review of Systems  Constitutional: Negative for fever and chills.  Respiratory: Negative.   Cardiovascular: Negative.   Gastrointestinal: Negative.   Musculoskeletal:       Per HPI, otherwise negative  Skin: Negative.   Neurological: Negative for weakness and numbness.    Allergies  Review of patient's allergies indicates no known allergies.  Home Medications   Current Outpatient Rx  Name  Route  Sig  Dispense  Refill  . INSULIN REGULAR HUMAN 100 UNIT/ML IJ SOLN   Subcutaneous   Inject 5-20 Units into the skin daily. 200-220=5units if over 250 she does 20 units.Not a set rate she does this on her own.         Marland Kitchen LISINOPRIL-HYDROCHLOROTHIAZIDE 10-12.5 MG PO TABS   Oral   Take 1 tablet by mouth daily.         Marland Kitchen METFORMIN HCL 500 MG PO TABS  Oral   Take 500 mg by mouth 2 (two) times daily with a meal.         . ADULT MULTIVITAMIN W/MINERALS CH   Oral   Take 1 tablet by mouth daily.           BP 183/92  Pulse 109  Temp 98.1 F (36.7 C) (Oral)  Resp 18  SpO2 100%  Physical Exam  Nursing note and vitals reviewed. Constitutional: She is oriented to person, place, and time. She appears well-developed and well-nourished. No distress.  HENT:  Head: Normocephalic and atraumatic.  Eyes: Conjunctivae normal and EOM are normal.  Cardiovascular: Normal rate and regular rhythm.   Pulmonary/Chest: Effort normal and breath sounds normal. No stridor. No respiratory distress.  Abdominal: She exhibits no distension.  Musculoskeletal: She exhibits no edema.       Left hip: Normal.       Left knee: Normal.       There is tenderness to palpation about the lateral greater than the medial aspect of the ankle.  The patient and flex and extend ankle minimally secondary to pain.  There is no tenderness to palpation anywhere about the foot.  The patient can flex and extend all toes appropriately.  Cap refill and pulses are appropriate.  Neurological: She is alert and oriented to person, place, and time. No cranial nerve deficit.  Skin: Skin is warm and dry.  Psychiatric: She has a normal mood and affect.    ED Course  SPLINT APPLICATION Date/Time: 05/23/2012 8:50 AM Performed by: Gerhard Munch Authorized by: Gerhard Munch Consent: Verbal consent obtained. Written consent not obtained. The procedure was performed in an emergent situation. Risks and benefits: risks, benefits and alternatives were discussed Consent given by: patient Patient identity confirmed: verbally with patient Time out: Immediately prior to procedure a "time out" was called to verify the correct patient, procedure, equipment, support staff and site/side marked as required. Location details: left ankle Splint type: cam walker. Post-procedure: The splinted  body part was neurovascularly unchanged following the procedure. Patient tolerance: Patient tolerated the procedure well with no immediate complications. Comments: Well tolerated   (including critical care time)  Labs Reviewed - No data to display No results found.   No diagnosis found.  I interpreted the x-rays.  I shared the results with the patient.  MDM  This patient presents after minor ankle trauma, and on exam has a deformity.  She is neurovascularly intact.  X-rays demonstrate a distal fibula fracture.  The patient has no other complaints, and the remainder of her radiographic studies are unremarkable.  She was discharged in stable condition with a cam walker, crutches, orthopedics followup   Gerhard Munch, MD 05/23/12 [phone]

## 2012-05-23 NOTE — ED Notes (Signed)
Pt sts her hands and feet were cramping, last night and as she was leaving work, she fell.  Sts it felt like her leg "gave out."  Sts as she fell, she "twisted" her ankle.  Pain 10/10.

## 2012-08-12 ENCOUNTER — Other Ambulatory Visit (HOSPITAL_COMMUNITY)
Admission: RE | Admit: 2012-08-12 | Discharge: 2012-08-12 | Disposition: A | Discharge status: Home / Self Care | Payer: BC Managed Care – PPO | Source: Ambulatory Visit | Attending: Emergency Medicine | Admitting: Emergency Medicine

## 2012-08-12 ENCOUNTER — Encounter (HOSPITAL_COMMUNITY): Payer: Self-pay | Admitting: Emergency Medicine

## 2012-08-12 ENCOUNTER — Emergency Department (HOSPITAL_COMMUNITY)
Admission: EM | Admit: 2012-08-12 | Discharge: 2012-08-12 | Disposition: A | Discharge status: Home / Self Care | Payer: BC Managed Care – PPO | Source: Home / Self Care | Attending: Emergency Medicine | Admitting: Emergency Medicine

## 2012-08-12 DIAGNOSIS — R102 Pelvic and perineal pain: Secondary | ICD-10-CM

## 2012-08-12 DIAGNOSIS — N949 Unspecified condition associated with female genital organs and menstrual cycle: Secondary | ICD-10-CM

## 2012-08-12 DIAGNOSIS — N760 Acute vaginitis: Secondary | ICD-10-CM | POA: Insufficient documentation

## 2012-08-12 DIAGNOSIS — R42 Dizziness and giddiness: Secondary | ICD-10-CM

## 2012-08-12 DIAGNOSIS — Z113 Encounter for screening for infections with a predominantly sexual mode of transmission: Secondary | ICD-10-CM | POA: Insufficient documentation

## 2012-08-12 DIAGNOSIS — R300 Dysuria: Secondary | ICD-10-CM

## 2012-08-12 LAB — POCT URINALYSIS DIP (DEVICE)
Glucose, UA: 250 mg/dL — AB
Nitrite: NEGATIVE
Specific Gravity, Urine: >=1.03 (ref 1.005–1.030)

## 2012-08-12 LAB — GLUCOSE, CAPILLARY: Glucose-Capillary: 193 mg/dL — ABNORMAL HIGH (ref 70–99)

## 2012-08-12 MED ORDER — LIDOCAINE HCL (PF) 1 % IJ SOLN
INTRAMUSCULAR | Status: AC
  Filled 2012-08-12: qty 5

## 2012-08-12 MED ORDER — TRAMADOL HCL 50 MG PO TABS: 100.0000 mg | ORAL_TABLET | Freq: Three times a day (TID) | ORAL | Status: DC | PRN

## 2012-08-12 MED ORDER — MECLIZINE HCL 25 MG PO TABS: 25.0000 mg | ORAL_TABLET | Freq: Four times a day (QID) | ORAL | Status: DC

## 2012-08-12 MED ORDER — AZITHROMYCIN 250 MG PO TABS
1000.0000 mg | ORAL_TABLET | Freq: Once | ORAL | Status: AC
  Administered 2012-08-12: 1000 mg via ORAL

## 2012-08-12 MED ORDER — CEFTRIAXONE SODIUM 1 G IJ SOLR
1.0000 g | Freq: Once | INTRAMUSCULAR | Status: AC
  Administered 2012-08-12: 1 g via INTRAMUSCULAR

## 2012-08-12 MED ORDER — METRONIDAZOLE 500 MG PO TABS: 500.0000 mg | ORAL_TABLET | Freq: Two times a day (BID) | ORAL | Status: DC

## 2012-08-12 MED ORDER — AZITHROMYCIN 250 MG PO TABS
ORAL_TABLET | ORAL | Status: AC
  Filled 2012-08-12: qty 4

## 2012-08-12 MED ORDER — CIPROFLOXACIN HCL 500 MG PO TABS: 500.0000 mg | ORAL_TABLET | Freq: Two times a day (BID) | ORAL | Status: DC

## 2012-08-12 MED ORDER — CEFTRIAXONE SODIUM 1 G IJ SOLR
INTRAMUSCULAR | Status: AC
  Filled 2012-08-12: qty 10

## 2012-08-12 NOTE — ED Notes (Signed)
Patient given meds will discharge at 5:00 p.m

## 2012-08-12 NOTE — ED Provider Notes (Signed)
Chief Complaint  Patient presents with  . Abdominal Cramping    no cyle for 12 years. 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 She has had a five-month history of pelvic pain. She was seen here for this back in September of last year. A urinalysis was obtained but it just grew out coag negative staph. The pain has gradually been getting worse since then. It's cramping, it's often a 10 over 10 in intensity. It's worse with any kind of movement. Is localized to the lower abdomen. It does not radiate. She's had some chills and some nausea but no fever or vomiting. She's also had a five-month history of urinary symptoms with dysuria, frequency, urgency, and occasional blood in her urine. She's been on 5 different antibiotics for this and continues to have symptoms. She also has had some spotting vaginal bleeding throughout the past 2 weeks. She states she has not had a normal menstrual period in 10 years. She denies any discharge, itching, or odor. The patient is sexually active, but states her boyfriend lives in [place] and they're not together very often, she states she hasn't had sex in months, but does state the pain is worse with intercourse.  A secondary problem has been dizziness. This has been going on the past couple days. It feels like the room is spinning. She's had some ringing in ears and some headache. Her vision is blurry and her feet feel numb and tingly. She feels weak all over and her head pounds. She fractured her ankle back in December and is being cared for by Dr. Ophelia Charter. She is in a Cam Walker right now at the ankle still is painful and swollen. She's had diabetes since 1996. She's been on 70/30 insulin, 20 units twice a day and metformin 1000 mg twice a day. Her blood sugar yesterday was 216 the day before that was 208. She  also has high blood pressure hepatitis C. She takes lisinopril.  Review of Systems:  Other than noted above, the patient denies any of the following symptoms: Systemic:  No fever, chills, sweats, fatigue, or weight loss. GI:  No abdominal pain, nausea, anorexia, vomiting, diarrhea, constipation, melena or hematochezia. GU:  No dysuria, frequency, urgency, hematuria, vaginal discharge, itching, or abnormal vaginal bleeding. Skin:  No rash or itching.   PMFSH:  Past medical history, family history, social history, meds, and allergies were reviewed.  Physical Exam:   Vital signs:  BP 128/78  Pulse 99  Temp(Src) 98.1 F (36.7 C) (Oral)  Resp 18  SpO2 100% General:  Alert, oriented and in no distress. Lungs:  Breath sounds clear and equal bilaterally.  No wheezes, rales or rhonchi. Heart:  Regular rhythm.  No gallops or murmers. Abdomen:  Soft, flat and non-distended.  No organomegaly or mass.  She has diffuse lower abdominal tenderness to palpation without guarding or rebound.  Bowel sounds normally active. Pelvic exam:  Normal external genitalia, vaginal and cervical mucosa were unremarkable. There was no bleeding or discharge. She had pain on movement of the cervix, her uterus was mildly enlarged and very tender to palpation. She had bilateral adnexal tenderness to palpation. Extremities: The Cam Dan Humphreys was removed from her left leg. The ankle is nonswollen but very tender to palpation. There is no erythema or heat. Skin:  Clear, warm  and dry.  Labs:   Results for orders placed during the hospital encounter of 08/12/12  GLUCOSE, CAPILLARY      Result Value Range   Glucose-Capillary 193 (*) 70 - 99 mg/dL  POCT URINALYSIS DIP (DEVICE)      Result Value Range   Glucose, UA 250 (*) NEGATIVE mg/dL   Bilirubin Urine SMALL (*) NEGATIVE   Ketones, ur TRACE (*) NEGATIVE mg/dL   Specific Gravity, Urine >=1.030  1.005 - 1.030   Hgb urine dipstick LARGE (*) NEGATIVE   pH 5.5  5.0 - 8.0    Protein, ur >=300 (*) NEGATIVE mg/dL   Urobilinogen, UA 1.0  0.0 - 1.0 mg/dL   Nitrite NEGATIVE  NEGATIVE   Leukocytes, UA LARGE (*) NEGATIVE    Other Labs Obtained at Urgent Care Center:  DNA probes for gonorrhea, Chlamydia, Gardnerella, Trichomonas, and Candida were obtained as well as a urine culture.  Results are pending at this time and we will call about any positive results.  Course in Urgent Care Center:   She was given Rocephin 1 g IM and azithromycin 1000 mg by mouth.  Assessment:  The primary encounter diagnosis was Pelvic pain. Diagnoses of Vertigo and Dysuria were also pertinent to this visit.  She's having a number of issues going on today. The pelvic pain seems to be the most pressing issue. This may be PID, fibroid tumors, ovarian cysts, endometriosis, or due to interstitial cystitis. She's had urinary symptoms going on for months with basically negative urine cultures and failed on multiple antibiotics. I think it's time that she see a urologist to be worked up for this. I think it's highly likely she has interstitial cystitis. The vertigo is probably just benign positional vertigo due to canalithiasis and should go away in a few days. Her ankle is giving her a lot of problems and this would probably result in chronic pain. I urged her to followup with Dr. Ophelia Charter.  Plan:   1.  The following meds were prescribed:   Discharge Medication List as of 08/12/2012  4:36 PM    START taking these medications   Details  ciprofloxacin (CIPRO) 500 MG tablet Take 1 tablet (500 mg total) by mouth every 12 (twelve) hours., Starting 08/12/2012, Until Discontinued, Normal    meclizine (ANTIVERT) 25 MG tablet Take 1 tablet (25 mg total) by mouth 4 (four) times daily., Starting 08/12/2012, Until Discontinued, Normal    metroNIDAZOLE (FLAGYL) 500 MG tablet Take 1 tablet (500 mg total) by mouth 2 (two) times daily., Starting 08/12/2012, Until Discontinued, Normal    traMADol (ULTRAM) 50 MG tablet Take 2  tablets (100 mg total) by mouth every 8 (eight) hours as needed for pain., Starting 08/12/2012, Until Discontinued, Normal       2.  The patient was instructed in symptomatic care and handouts were given. 3.  The patient was told to return if becoming worse in any way, if no better in 3 or 4 days, and given some red flag symptoms that would indicate earlier return.  Follow up:  The patient was told to follow up she will need both urological and gynecological workups. She was given the name of the [facility] in the phone number they're to call for the workup of the vaginal bleeding. She'll also need to see a urologist as well and was given the name of Dr. Mena Goes. She should return here in 48 hours for recheck as well.     Reuben Likes,  MD 08/12/12 1934

## 2012-08-12 NOTE — ED Notes (Signed)
Patient vitals checked due to feeling faint. Patient is a diabetic. She stated she ate 30 minutes ago and had taken her insulin. Patient states she ate chicken and greenbeans. She states she was here due to cramping and spotting. She is hemodynamically stable and was placed back in the waiting room.

## 2012-08-12 NOTE — ED Notes (Signed)
Pt c/o cramping and spotting. Pt states that she has not had a cycle in 12 years.  Pt is feeling fatigue, dizzy. Pt has used advil with mild relief of symptoms.

## 2012-08-14 LAB — URINE CULTURE

## 2012-08-28 ENCOUNTER — Other Ambulatory Visit: Payer: Self-pay | Admitting: Urology

## 2012-08-30 NOTE — Patient Instructions (Addendum)
20 [name]  08/30/2012   Your procedure is scheduled on:09/07/12   Report to [name] at 0830 AM.  Call this number if you have problems the morning of surgery 336-: [phone]   Remember: half dose of Lantis 09/06/12 night   Do not eat food or drink liquids After Midnight.     Take these medicines the morning of surgery with A SIP OF WATER: vicodin or tramadol if needed   Do not wear jewelry, make-up or nail polish.  Do not wear lotions, powders, or perfumes. You may wear deodorant.  Do not shave 48 hours prior to surgery. Men may shave face and neck.  Do not bring valuables to the hospital.  Contacts, dentures or bridgework may not be worn into surgery.    Patients discharged the day of surgery will not be allowed to drive home.  Name and phone number of your driver: Charo Philipp [phone]     Please read over the following fact sheets that you were given: MRSA Information.  Birdie Sons, RN  pre op nurse call if needed [phone]    FAILURE TO FOLLOW THESE INSTRUCTIONS MAY RESULT IN CANCELLATION OF YOUR SURGERY   Patient Signature: ___________________________________________

## 2012-08-31 ENCOUNTER — Ambulatory Visit (HOSPITAL_COMMUNITY)
Admission: RE | Admit: 2012-08-31 | Discharge: 2012-08-31 | Disposition: A | Discharge status: Home / Self Care | Payer: BC Managed Care – PPO | Source: Ambulatory Visit | Attending: Urology | Admitting: Urology

## 2012-08-31 ENCOUNTER — Encounter (HOSPITAL_COMMUNITY): Payer: Self-pay | Admitting: Pharmacy Technician

## 2012-08-31 ENCOUNTER — Encounter (HOSPITAL_COMMUNITY)
Admission: RE | Admit: 2012-08-31 | Discharge: 2012-08-31 | Disposition: A | Discharge status: Home / Self Care | Payer: BC Managed Care – PPO | Source: Ambulatory Visit | Attending: Urology | Admitting: Urology

## 2012-08-31 ENCOUNTER — Encounter (HOSPITAL_COMMUNITY): Payer: Self-pay

## 2012-08-31 DIAGNOSIS — N289 Disorder of kidney and ureter, unspecified: Secondary | ICD-10-CM | POA: Insufficient documentation

## 2012-08-31 DIAGNOSIS — Z01812 Encounter for preprocedural laboratory examination: Secondary | ICD-10-CM | POA: Insufficient documentation

## 2012-08-31 DIAGNOSIS — E119 Type 2 diabetes mellitus without complications: Secondary | ICD-10-CM | POA: Insufficient documentation

## 2012-08-31 DIAGNOSIS — I10 Essential (primary) hypertension: Secondary | ICD-10-CM | POA: Insufficient documentation

## 2012-08-31 DIAGNOSIS — R1900 Intra-abdominal and pelvic swelling, mass and lump, unspecified site: Secondary | ICD-10-CM | POA: Insufficient documentation

## 2012-08-31 HISTORY — DX: Anxiety disorder, unspecified: F41.9

## 2012-08-31 HISTORY — DX: Depression, unspecified: F32.A

## 2012-08-31 HISTORY — DX: Palpitations: R00.2

## 2012-08-31 HISTORY — DX: Major depressive disorder, single episode, unspecified: F32.9

## 2012-08-31 HISTORY — DX: Type 2 diabetes mellitus with diabetic neuropathy, unspecified: E11.40

## 2012-08-31 HISTORY — DX: Gastro-esophageal reflux disease without esophagitis: K21.9

## 2012-08-31 HISTORY — DX: Bronchitis, not specified as acute or chronic: J40

## 2012-08-31 HISTORY — DX: Unspecified osteoarthritis, unspecified site: M19.90

## 2012-08-31 LAB — CBC
HCT: 37.7 % (ref 36.0–46.0)
MCV: 82.1 fL (ref 78.0–100.0)
Platelets: 77 10*3/uL — ABNORMAL LOW (ref 150–400)
RBC: 4.59 MIL/uL (ref 3.87–5.11)
WBC: 4 10*3/uL (ref 4.0–10.5)

## 2012-08-31 LAB — BASIC METABOLIC PANEL
CO2: 25 mEq/L (ref 19–32)
Chloride: 104 mEq/L (ref 96–112)
GFR calc Af Amer: >90 mL/min (ref >90)
Sodium: 137 mEq/L (ref 135–145)

## 2012-08-31 LAB — SURGICAL PCR SCREEN
MRSA, PCR: NEGATIVE
Staphylococcus aureus: NEGATIVE

## 2012-09-05 ENCOUNTER — Encounter: Payer: Self-pay | Admitting: Advanced Practice Midwife

## 2012-09-07 ENCOUNTER — Ambulatory Visit (HOSPITAL_COMMUNITY): Payer: BC Managed Care – PPO | Admitting: Anesthesiology

## 2012-09-07 ENCOUNTER — Encounter (HOSPITAL_COMMUNITY): Payer: Self-pay | Admitting: Anesthesiology

## 2012-09-07 ENCOUNTER — Ambulatory Visit (HOSPITAL_COMMUNITY)
Admission: RE | Admit: 2012-09-07 | Discharge: 2012-09-07 | Disposition: A | Discharge status: Home / Self Care | Payer: BC Managed Care – PPO | Source: Ambulatory Visit | Attending: Urology | Admitting: Urology

## 2012-09-07 ENCOUNTER — Encounter (HOSPITAL_COMMUNITY)
Admission: RE | Disposition: A | Discharge status: Home / Self Care | Payer: Self-pay | Source: Ambulatory Visit | Attending: Urology

## 2012-09-07 ENCOUNTER — Inpatient Hospital Stay (HOSPITAL_COMMUNITY)
Admission: EM | Admit: 2012-09-07 | Discharge: 2012-09-11 | DRG: 310 | Disposition: A | Discharge status: Home / Self Care | Payer: BC Managed Care – PPO | Attending: Urology | Admitting: Urology

## 2012-09-07 ENCOUNTER — Encounter (HOSPITAL_COMMUNITY): Payer: Self-pay

## 2012-09-07 ENCOUNTER — Ambulatory Visit (HOSPITAL_COMMUNITY): Payer: BC Managed Care – PPO

## 2012-09-07 ENCOUNTER — Encounter (HOSPITAL_COMMUNITY): Payer: Self-pay | Admitting: *Deleted

## 2012-09-07 DIAGNOSIS — F3289 Other specified depressive episodes: Secondary | ICD-10-CM | POA: Insufficient documentation

## 2012-09-07 DIAGNOSIS — F1410 Cocaine abuse, uncomplicated: Secondary | ICD-10-CM | POA: Insufficient documentation

## 2012-09-07 DIAGNOSIS — D303 Benign neoplasm of bladder: Principal | ICD-10-CM | POA: Diagnosis present

## 2012-09-07 DIAGNOSIS — Z87891 Personal history of nicotine dependence: Secondary | ICD-10-CM

## 2012-09-07 DIAGNOSIS — I10 Essential (primary) hypertension: Secondary | ICD-10-CM | POA: Insufficient documentation

## 2012-09-07 DIAGNOSIS — N939 Abnormal uterine and vaginal bleeding, unspecified: Secondary | ICD-10-CM

## 2012-09-07 DIAGNOSIS — D500 Iron deficiency anemia secondary to blood loss (chronic): Secondary | ICD-10-CM | POA: Diagnosis present

## 2012-09-07 DIAGNOSIS — K219 Gastro-esophageal reflux disease without esophagitis: Secondary | ICD-10-CM | POA: Diagnosis present

## 2012-09-07 DIAGNOSIS — F1610 Hallucinogen abuse, uncomplicated: Secondary | ICD-10-CM | POA: Insufficient documentation

## 2012-09-07 DIAGNOSIS — D696 Thrombocytopenia, unspecified: Secondary | ICD-10-CM | POA: Diagnosis present

## 2012-09-07 DIAGNOSIS — J40 Bronchitis, not specified as acute or chronic: Secondary | ICD-10-CM | POA: Insufficient documentation

## 2012-09-07 DIAGNOSIS — Z794 Long term (current) use of insulin: Secondary | ICD-10-CM | POA: Insufficient documentation

## 2012-09-07 DIAGNOSIS — F329 Major depressive disorder, single episode, unspecified: Secondary | ICD-10-CM | POA: Insufficient documentation

## 2012-09-07 DIAGNOSIS — E1149 Type 2 diabetes mellitus with other diabetic neurological complication: Secondary | ICD-10-CM | POA: Diagnosis present

## 2012-09-07 DIAGNOSIS — E1142 Type 2 diabetes mellitus with diabetic polyneuropathy: Secondary | ICD-10-CM | POA: Insufficient documentation

## 2012-09-07 DIAGNOSIS — F1210 Cannabis abuse, uncomplicated: Secondary | ICD-10-CM | POA: Insufficient documentation

## 2012-09-07 DIAGNOSIS — N949 Unspecified condition associated with female genital organs and menstrual cycle: Secondary | ICD-10-CM | POA: Insufficient documentation

## 2012-09-07 DIAGNOSIS — B1920 Unspecified viral hepatitis C without hepatic coma: Secondary | ICD-10-CM | POA: Diagnosis present

## 2012-09-07 DIAGNOSIS — F411 Generalized anxiety disorder: Secondary | ICD-10-CM | POA: Diagnosis present

## 2012-09-07 DIAGNOSIS — G8929 Other chronic pain: Secondary | ICD-10-CM | POA: Insufficient documentation

## 2012-09-07 DIAGNOSIS — N950 Postmenopausal bleeding: Secondary | ICD-10-CM | POA: Diagnosis present

## 2012-09-07 DIAGNOSIS — N3090 Cystitis, unspecified without hematuria: Secondary | ICD-10-CM | POA: Insufficient documentation

## 2012-09-07 DIAGNOSIS — D259 Leiomyoma of uterus, unspecified: Secondary | ICD-10-CM | POA: Insufficient documentation

## 2012-09-07 HISTORY — PX: CYSTOSCOPY W/ RETROGRADES: SHX1426

## 2012-09-07 HISTORY — PX: TRANSURETHRAL RESECTION OF BLADDER TUMOR: SHX2575

## 2012-09-07 LAB — BASIC METABOLIC PANEL
CO2: 26 mEq/L (ref 19–32)
Calcium: 8.8 mg/dL (ref 8.4–10.5)
Creatinine, Ser: 0.6 mg/dL (ref 0.50–1.10)
Glucose, Bld: 296 mg/dL — ABNORMAL HIGH (ref 70–99)

## 2012-09-07 LAB — GLUCOSE, CAPILLARY
Glucose-Capillary: 90 mg/dL (ref 70–99)
Glucose-Capillary: 59 mg/dL — ABNORMAL LOW (ref 70–99)
Glucose-Capillary: 111 mg/dL — ABNORMAL HIGH (ref 70–99)

## 2012-09-07 SURGERY — TURBT (TRANSURETHRAL RESECTION OF BLADDER TUMOR)
Anesthesia: General | Wound class: Clean Contaminated

## 2012-09-07 MED ORDER — IOHEXOL 300 MG/ML  SOLN
INTRAMUSCULAR | Status: AC
  Filled 2012-09-07: qty 1

## 2012-09-07 MED ORDER — DEXTROSE 5 % IV SOLN
300.0000 mg | INTRAVENOUS | Status: AC
  Administered 2012-09-07: 300 mg via INTRAVENOUS
  Filled 2012-09-07: qty 7.5

## 2012-09-07 MED ORDER — IOHEXOL 300 MG/ML  SOLN
INTRAMUSCULAR | Status: DC | PRN
  Administered 2012-09-07: 20 mL

## 2012-09-07 MED ORDER — FENTANYL CITRATE 0.05 MG/ML IJ SOLN
INTRAMUSCULAR | Status: DC | PRN
  Administered 2012-09-07: 50 ug via INTRAVENOUS

## 2012-09-07 MED ORDER — LIDOCAINE HCL 2 % EX GEL
CUTANEOUS | Status: AC
  Filled 2012-09-07: qty 10

## 2012-09-07 MED ORDER — DEXTROSE 50 % IV SOLN
12.5000 g | Freq: Once | INTRAVENOUS | Status: AC
  Administered 2012-09-07: 12.5 g via INTRAVENOUS

## 2012-09-07 MED ORDER — PROPOFOL 10 MG/ML IV BOLUS
INTRAVENOUS | Status: DC | PRN
  Administered 2012-09-07: 150 mg via INTRAVENOUS

## 2012-09-07 MED ORDER — TRAMADOL HCL 50 MG PO TABS: 100.0000 mg | ORAL_TABLET | Freq: Three times a day (TID) | ORAL | Status: AC | PRN

## 2012-09-07 MED ORDER — LACTATED RINGERS IV SOLN
INTRAVENOUS | Status: DC | PRN
  Administered 2012-09-07 (×2): via INTRAVENOUS

## 2012-09-07 MED ORDER — PHENAZOPYRIDINE HCL 100 MG PO TABS: 100.0000 mg | ORAL_TABLET | Freq: Three times a day (TID) | ORAL | Status: DC | PRN

## 2012-09-07 MED ORDER — LACTATED RINGERS IV SOLN: INTRAVENOUS | Status: DC

## 2012-09-07 MED ORDER — HYDROMORPHONE HCL PF 1 MG/ML IJ SOLN: 0.2500 mg | INTRAMUSCULAR | Status: DC | PRN

## 2012-09-07 MED ORDER — TRAMADOL HCL 50 MG PO TABS
100.0000 mg | ORAL_TABLET | Freq: Three times a day (TID) | ORAL | Status: DC | PRN
  Administered 2012-09-07: 100 mg via ORAL
  Filled 2012-09-07: qty 2

## 2012-09-07 MED ORDER — PHENYLEPHRINE HCL 10 MG/ML IJ SOLN
INTRAMUSCULAR | Status: DC | PRN
  Administered 2012-09-07 (×3): 80 ug via INTRAVENOUS

## 2012-09-07 MED ORDER — SUCCINYLCHOLINE CHLORIDE 20 MG/ML IJ SOLN
INTRAMUSCULAR | Status: DC | PRN
  Administered 2012-09-07 (×2): 30 mg via INTRAVENOUS

## 2012-09-07 MED ORDER — LIDOCAINE HCL 1 % IJ SOLN
INTRAMUSCULAR | Status: DC | PRN
  Administered 2012-09-07: 40 mg via INTRADERMAL

## 2012-09-07 MED ORDER — ONDANSETRON HCL 4 MG/2ML IJ SOLN
INTRAMUSCULAR | Status: DC | PRN
  Administered 2012-09-07: 4 mg via INTRAVENOUS

## 2012-09-07 MED ORDER — SODIUM CHLORIDE 0.9 % IR SOLN
Status: DC | PRN
  Administered 2012-09-07 (×2): 3000 mL

## 2012-09-07 MED ORDER — DEXTROSE 50 % IV SOLN
INTRAVENOUS | Status: DC
Start: 2012-09-07 — End: 2012-09-07
  Filled 2012-09-07: qty 50

## 2012-09-07 SURGICAL SUPPLY — 28 items
ADAPTER CATH URET PLST 4-6FR (CATHETERS) ×3 IMPLANT
ADPR CATH URET STRL DISP 4-6FR (CATHETERS) ×2
BAG URINE DRAINAGE (UROLOGICAL SUPPLIES) IMPLANT
BAG URO CATCHER STRL LF (DRAPE) ×3 IMPLANT
CATH INTERMIT  6FR 70CM (CATHETERS) ×1 IMPLANT
CATH URET 5FR 28IN CONE TIP (BALLOONS)
CATH URET 5FR 70CM CONE TIP (BALLOONS) IMPLANT
CLOTH BEACON ORANGE TIMEOUT ST (SAFETY) ×3 IMPLANT
COVER SURGICAL LIGHT HANDLE (MISCELLANEOUS) ×1 IMPLANT
DRAPE CAMERA CLOSED 9X96 (DRAPES) ×3 IMPLANT
ELECT BLADE TIP CTD 4 INCH (ELECTRODE) ×1 IMPLANT
ELECT LOOP MED HF 24F 12D CBL (CLIP) ×1 IMPLANT
ELECT REM PT RETURN 9FT ADLT (ELECTROSURGICAL) ×3
ELECTRODE REM PT RTRN 9FT ADLT (ELECTROSURGICAL) ×2 IMPLANT
EVACUATOR MICROVAS BLADDER (UROLOGICAL SUPPLIES) IMPLANT
GLOVE BIOGEL M STRL SZ7.5 (GLOVE) ×3 IMPLANT
GOWN STRL NON-REIN LRG LVL3 (GOWN DISPOSABLE) ×6 IMPLANT
GUIDEWIRE STR DUAL SENSOR (WIRE) ×3 IMPLANT
KIT ASPIRATION TUBING (SET/KITS/TRAYS/PACK) IMPLANT
LASER FIBER DISP (UROLOGICAL SUPPLIES) IMPLANT
LOOPS RESECTOSCOPE DISP (ELECTROSURGICAL) ×3 IMPLANT
MANIFOLD NEPTUNE II (INSTRUMENTS) ×3 IMPLANT
NS IRRIG 1000ML POUR BTL (IV SOLUTION) IMPLANT
PACK CYSTO (CUSTOM PROCEDURE TRAY) ×3 IMPLANT
PENCIL BUTTON HOLSTER BLD 10FT (ELECTRODE) ×1 IMPLANT
SYRINGE IRR TOOMEY STRL 70CC (SYRINGE) IMPLANT
TUBING CONNECTING 10 (TUBING) ×3 IMPLANT
WIRE COONS/BENSON .038X145CM (WIRE) ×3 IMPLANT

## 2012-09-07 NOTE — ED Notes (Signed)
Pt presents with increased pain after having a cervical and bladder biospy today for fibroid tumors. Pt also reports lower abdominal pain. Bleeding reported by pt.

## 2012-09-07 NOTE — Anesthesia Postprocedure Evaluation (Signed)
  Anesthesia Post-op Note  Patient: [name]  Procedure(s) Performed: Procedure(s) (LRB): TRANSURETHRAL RESECTION OF BLADDER TUMOR (TURBT) (N/A) CYSTOSCOPY WITH RETROGRADE PYELOGRAM (Bilateral)  Patient Location: PACU  Anesthesia Type: General  Level of Consciousness: awake and alert   Airway and Oxygen Therapy: Patient Spontanous Breathing  Post-op Pain: mild  Post-op Assessment: Post-op Vital signs reviewed, Patient's Cardiovascular Status Stable, Respiratory Function Stable, Patent Airway and No signs of Nausea or vomiting  Last Vitals:  Filed Vitals:   09/07/12 1315  BP: 146/81  Pulse: 94  Temp:   Resp: 20    Post-op Vital Signs: stable   Complications: No apparent anesthesia complications

## 2012-09-07 NOTE — ED Provider Notes (Signed)
History     CSN: [id number]  Arrival date & time 09/07/12  2234   First MD Initiated Contact with Patient 09/07/12 2243      Chief Complaint  Patient presents with  . Vaginal Bleeding    (Consider location/radiation/quality/duration/timing/severity/associated sxs/prior treatment) HPI Comments: Patient comes to the ER for evaluation of vaginal bleeding. Patient reports that she has a bladder/pelvic mass and had transurethral bladder biopsy and cervical biopsy today. After going home she has had difficulty passing urine has had progressively worsening pain. She has been passing blood and clots from the vagina. Patient did take the pain medication given to her but it has not helped the pain. Patient's pain is now severe and she feels like she is going to pass out.  Patient is a 57 y.o. female presenting with vaginal bleeding.  Vaginal Bleeding Associated symptoms include abdominal pain. Pertinent negatives include no chest pain.    Past Medical History  Diagnosis Date  . Diabetes mellitus   . Hypertension   . Heart palpitations   . Anxiety   . Depression   . Bronchitis   . GERD (gastroesophageal reflux disease)     hx of  . Arthritis     shoulder  . Hepatitis     hepatitis C  . Diabetic neuropathy     Past Surgical History  Procedure Laterality Date  . Tonsillectomy    . Tubal ligation    . Cystostomy w/ bladder biopsy    . Cervical biopsy      No family history on file.  History  Substance Use Topics  . Smoking status: Former Smoker -- 0.25 packs/day for 20 years    Types: Cigarettes    Quit date: 06/13/2000  . Smokeless tobacco: Never Used  . Alcohol Use: No    OB History   Grav Para Term Preterm Abortions TAB SAB Ect Mult Living                  Review of Systems  Cardiovascular: Negative for chest pain.  Gastrointestinal: Positive for abdominal pain.  Genitourinary: Positive for decreased urine volume and vaginal bleeding.  Neurological: Positive  for dizziness.  All other systems reviewed and are negative.    Allergies  Review of patient's allergies indicates no known allergies.  Home Medications   Current Outpatient Rx  Name  Route  Sig  Dispense  Refill  . insulin glargine (LANTUS) 100 UNIT/ML injection   Subcutaneous   Inject 30 Units into the skin at bedtime.         . insulin regular (NOVOLIN R,HUMULIN R) 100 units/mL injection   Subcutaneous   Inject 5-20 Units into the skin daily as needed (per sliding scale). 200-220=5units if over 250 she does 20 units.Not a set rate she does this on her own.         . metFORMIN (GLUCOPHAGE) 1000 MG tablet   Oral   Take 1,000 mg by mouth 2 (two) times daily with a meal.         . Multiple Vitamin (MULTIVITAMIN WITH MINERALS) TABS   Oral   Take 1 tablet by mouth daily.         Marland Kitchen lisinopril-hydrochlorothiazide (PRINZIDE,ZESTORETIC) 10-12.5 MG per tablet   Oral   Take 1 tablet by mouth daily.         . phenazopyridine (PYRIDIUM) 100 MG tablet   Oral   Take 1 tablet (100 mg total) by mouth 3 (three) times daily as needed  for pain. (urinary burning post-op)   30 tablet   1   . traMADol (ULTRAM) 50 MG tablet   Oral   Take 2 tablets (100 mg total) by mouth every 8 (eight) hours as needed for pain.   30 tablet   0     There were no vitals taken for this visit.  Physical Exam  Constitutional: She is oriented to person, place, and time. She appears well-developed and well-nourished. She appears distressed.  HENT:  Head: Normocephalic and atraumatic.  Right Ear: Hearing normal.  Nose: Nose normal.  Mouth/Throat: Oropharynx is clear and moist and mucous membranes are normal.  Eyes: Conjunctivae and EOM are normal. Pupils are equal, round, and reactive to light.  Neck: Normal range of motion. Neck supple.  Cardiovascular: Normal rate, regular rhythm, S1 normal and S2 normal.  Exam reveals no gallop and no friction rub.   No murmur heard. Pulmonary/Chest: Effort  normal and breath sounds normal. No respiratory distress. She exhibits no tenderness.  Abdominal: Soft. Normal appearance and bowel sounds are normal. There is no hepatosplenomegaly. There is tenderness in the suprapubic area. There is no rebound, no guarding, no tenderness at McBurney's point and negative Murphy's sign. No hernia.  Genitourinary: There is bleeding around the vagina.  Large clots in vagina obscuring cervix  Musculoskeletal: Normal range of motion.  Neurological: She is alert and oriented to person, place, and time. She has normal strength. No cranial nerve deficit or sensory deficit. Coordination normal. GCS eye subscore is 4. GCS verbal subscore is 5. GCS motor subscore is 6.  Skin: Skin is warm, dry and intact. No rash noted. No cyanosis.  Psychiatric: She has a normal mood and affect. Her speech is normal and behavior is normal. Thought content normal.    ED Course  Procedures (including critical care time)  Labs Reviewed  PROTIME-INR - Abnormal; Notable for the following:    Prothrombin Time 16.3 (*)    All other components within normal limits  APTT  CBC WITH DIFFERENTIAL  BASIC METABOLIC PANEL  URINALYSIS, MICROSCOPIC ONLY  TYPE AND SCREEN   Dg Retrograde Pyelogram  09/07/2012  *RADIOLOGY REPORT*  Clinical Data: Bladder  RETROGRADE PYELOGRAM  Comparison: CT 08/27/2012  Findings: Bilateral retrograde pyelograms demonstrate no filling defects within the ureters. Contrast refluxes to the renal pelves. Large uterine fibroid noted  IMPRESSION: No filling defects on retrograde pyelograms.   Original Report Authenticated By: Genevive Bi, M.D.    Dg Retrograde Pyelogram  09/07/2012  *RADIOLOGY REPORT*  Clinical Data: Bladder  RETROGRADE PYELOGRAM  Comparison: CT 08/27/2012  Findings: Bilateral retrograde pyelograms demonstrate no filling defects within the ureters. Contrast refluxes to the renal pelves. Large uterine fibroid noted  IMPRESSION: No filling defects on  retrograde pyelograms.   Original Report Authenticated By: Genevive Bi, M.D.      Diagnosis: 1. Hematuria secondary to bladder mass biopsy 2. Bleeding from cervical biopsy site 3. Hypotension due to hemorrhaging 4. Thrombocytopenia    MDM  Patient comes to the ER with progressively worsening pain in the lower abdomen and pelvic region after bladder and cervical biopsy earlier today. A Foley catheter was placed and the patient had 750 mL of frank blood in her bladder. Pelvic exam was performed and was also blood and clots in the vagina, likely secondary to the cervical biopsy site.  Patient was hypertensive at arrival to the ER. After the Foley catheter was placed in her bladder and the pressure was relieved and she became more  comfortable, blood pressure dropped. She cycled at 97 systolic. Patient was administered IV fluid bolus.  Review of patient's records reveals a history of chronic thrombocytopenia. She has a history of liver disease, specifically hepatitis. I have added hepatic panel on to evaluate her liver function, but suspect she has some significant chronic liver disease because her INR is elevated at 1.34 in addition to thrombocytopenia.  Case was discussed with Dr. Brunilda Payor, on-call for urology. I discussed with him the patient's thrombocytopenia and oral anticoagulation, likely liver disease. Dr. Brunilda Payor will admit the patient and his ordering transfusion of blood and platelets/FFP. She will be admitted to the step down unit.    Gilda Crease, MD 09/08/12 [phone]

## 2012-09-07 NOTE — Progress Notes (Signed)
Dr. Leta Jungling made aware of patient's CBG results in PACU- Orders given.

## 2012-09-07 NOTE — Anesthesia Preprocedure Evaluation (Addendum)
Anesthesia Evaluation  Patient identified by MRN, date of birth, ID band Patient awake    Reviewed: Allergy & Precautions, H&P , NPO status , Patient's Chart, lab work & pertinent test results  Airway Mallampati: II TM Distance: >3 FB Neck ROM: full    Dental  (+) Edentulous Upper, Missing and Dental Advisory Given Only a few lower front teeth left.  None loose:   Pulmonary neg pulmonary ROS,  breath sounds clear to auscultation  Pulmonary exam normal       Cardiovascular Exercise Tolerance: Good hypertension, Pt. on medications Rhythm:regular Rate:Normal     Neuro/Psych negative neurological ROS  negative psych ROS   GI/Hepatic negative GI ROS, GERD-  Medicated and Controlled,(+) Hepatitis -, C  Endo/Other  diabetes, Well Controlled, Type 2, Oral Hypoglycemic Agents and Insulin Dependent  Renal/GU negative Renal ROS  negative genitourinary   Musculoskeletal   Abdominal   Peds  Hematology negative hematology ROS (+)   Anesthesia Other Findings   Reproductive/Obstetrics negative OB ROS                          Anesthesia Physical Anesthesia Plan  ASA: III  Anesthesia Plan: General   Post-op Pain Management:    Induction: Intravenous  Airway Management Planned: Oral ETT  Additional Equipment:   Intra-op Plan:   Post-operative Plan: Extubation in OR  Informed Consent: I have reviewed the patients History and Physical, chart, labs and discussed the procedure including the risks, benefits and alternatives for the proposed anesthesia with the patient or authorized representative who has indicated his/her understanding and acceptance.   Dental Advisory Given  Plan Discussed with: CRNA and Surgeon  Anesthesia Plan Comments:         Anesthesia Quick Evaluation

## 2012-09-07 NOTE — Brief Op Note (Signed)
09/07/2012  12:42 PM  PATIENT:  Coretta Leisey  57 y.o. female  PRE-OPERATIVE DIAGNOSIS:  bladder masses, pelvic masses  POST-OPERATIVE DIAGNOSIS:  bladder masses, pelvic masses  PROCEDURE:  Procedure(s) with comments: TRANSURETHRAL RESECTION OF BLADDER TUMOR (TURBT) (N/A) - Also Exam Under Anesthesia and Possible Cervical Biopsy   CYSTOSCOPY WITH RETROGRADE PYELOGRAM (Bilateral)  SURGEON:  Surgeon(s) and Role:    * Sebastian Ache, MD - Primary  PHYSICIAN ASSISTANT:   ASSISTANTS: none   ANESTHESIA:   general  EBL:     BLOOD ADMINISTERED:none  DRAINS: none   LOCAL MEDICATIONS USED:  NONE  SPECIMEN:  Source of Specimen:  1 - Bladder erythema, 2 - Cervical erythema  DISPOSITION OF SPECIMEN:  PATHOLOGY  COUNTS:  YES  TOURNIQUET:  * No tourniquets in log *  DICTATION: .Other Dictation: Dictation Number [id number]  PLAN OF CARE: Discharge to home after PACU  PATIENT DISPOSITION:  PACU - hemodynamically stable.   Delay start of Pharmacological VTE agent (>24hrs) due to surgical blood loss or risk of bleeding: no

## 2012-09-07 NOTE — H&P (Signed)
[name] is an 57 y.o. female.    Chief Complaint: Pre-Op Cysto, Bladder Biopsy, Exam Under Anesthesia, Possible TURBT, Possible Cervical Biopsy.  HPI:     1 - Microscopic Hematuria - pt with microblood on UA x several in [facility] system, UCX neg at time. Previous 15PY smoker. No dye / chemical exposures. Thinks she may have had 2 prior gross episodes. CT Urogram 08/2012 with thickened, enhancing bladder, cysto with concern for bladder / or other pelvic cancer.   2 - Female Pelvic Pain / urinary Freqeuncy + Urgency - Pt with several mos of low abdominal pain and cramping, worse with intercourse. No relation to voiding. Pelvic 08/2012 with diffuse tenderness in all quadrants. PVR 08/2012 120-182mL. She has has multiple rounds of ABX from multiple providers over last 6 mos without improvement.  UCX, GC/wet prept from ER and here at [facility] 08/2012 negative. Urge/Freq is day and nightime. No prior OAB-directed therapy. Pt with very large fibroids by CT wtih obvious mass-effect on bladder.   3 - Bladder / Pelvic Masses - Pt with heterogenous enhancement of bladder / uterus / cervix by CT and non-papillary nodular appearing neoplasm by office cystoscopy. She has no PCP at present and has not had GYN eval in many years.  PMH sig for IDDM2 with neuropathy, chronic pain (chronic narcotics), Depression, HepC, HTN, BTL, TNA.  Today Cornelious is seen to proceed with endoscopic evaluation for her pelvic mass. No interval fevers. Most recent UCX negative.     Past Medical History  Diagnosis Date  . Diabetes mellitus   . Hypertension   . Heart palpitations   . Anxiety   . Depression   . Bronchitis   . GERD (gastroesophageal reflux disease)     hx of  . Arthritis     shoulder  . Hepatitis     hepatitis C  . Diabetic neuropathy     Past Surgical History  Procedure Laterality Date  . Tonsillectomy    . Tubal ligation      No family history on file. Social History:  reports that she quit smoking  about 12 years ago. Her smoking use included Cigarettes. She has a 5 pack-year smoking history. She has never used smokeless tobacco. 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 She reports that she does not drink alcohol.  Allergies: No Known Allergies  No prescriptions prior to admission    No results found for this or any previous visit (from the past 48 hour(s)). No results found.  Review of Systems  Constitutional: Negative.  Negative for fever and chills.  HENT: Negative.   Eyes: Negative.   Respiratory: Negative.   Cardiovascular: Negative.   Gastrointestinal: Negative.   Genitourinary: Positive for urgency, frequency and hematuria. Negative for flank pain.  Musculoskeletal: Negative.   Skin: Negative.   Neurological: Negative.   Endo/Heme/Allergies: Negative.   Psychiatric/Behavioral: Negative.     There were no vitals taken for this visit. Physical Exam  Constitutional: She is oriented to person, place, and time. She appears well-developed and well-nourished.  HENT:  Head: Normocephalic and atraumatic.  Eyes: EOM are normal. Pupils are equal, round, and reactive to light.  Neck: Normal range of motion. Neck supple.  Cardiovascular: Normal rate and regular rhythm.   Respiratory: Effort normal and breath sounds normal.  GI: Soft. Bowel sounds are normal.  Genitourinary:  No CVAT  Musculoskeletal: Normal range of motion.  Neurological: She is alert and oriented to person,  place, and time.  Skin: Skin is warm and dry.  Psychiatric: She has a normal mood and affect. Her behavior is normal. Judgment and thought content normal.     Assessment/Plan  1 - Microscopic Hematuria - Eval wtih labs, imaging, cysto wtih worry of bladder v. GYN cancer, will proceed with endoscopic evaluation today.   2 - Female Pelvic Pain / Urinary Urgency / Frequency- Proceed with eval as per below to r/o neoplasm.   3 - Bladder / Pelvic Masses -  Proceed with eval as per below to r/o neoplasm.   We discussed operative biopsy / transurethral resection as the best next step for diagnostic and therapeutic purposes with goals being to remove all visible cancer and obtain tissue for pathologic exam. We discussed that for some low-grade tumors, this may be all the treatment required, but that for many other tumors such as high-grade lesions, further therapy including surgery and or chemotherapy may be warranted. We also outlined the fact that any bladder cancer diagnosis will require close follow-up with periodic upper and lower tract evaluation. We discussed risks including bleeding, infection, damage to kidney / ureter / bladder including bladder perforation which can typically managed with prolonged foley catheterization. We mentioned anesthetic and other rare risks including DVT, PE, MI, and mortality. I also mentioned that adjunctive procedures such as ureteral stenting, retrograde pyelography, and ureteroscopy may be necessary to fully evaluate the urinary tract depending on intra-operative findings. After answering all questions to the patient's satisfaction, they wish to proceed.   Tadeusz Stahl 09/07/2012, 6:13 AM

## 2012-09-07 NOTE — Transfer of Care (Signed)
Immediate Anesthesia Transfer of Care Note  Patient: [name]  Procedure(s) Performed: Procedure(s) with comments: TRANSURETHRAL RESECTION OF BLADDER TUMOR (TURBT) (N/A) - Also Exam Under Anesthesia and Cervical Biopsy   CYSTOSCOPY WITH RETROGRADE PYELOGRAM (Bilateral)  Patient Location: PACU  Anesthesia Type:General  Level of Consciousness: awake, alert  and oriented  Airway & Oxygen Therapy: Patient Spontanous Breathing and Patient connected to face mask oxygen  Post-op Assessment: Report given to PACU RN, Post -op Vital signs reviewed and stable and Patient moving all extremities  Post vital signs: Reviewed and stable  Complications: No apparent anesthesia complications

## 2012-09-07 NOTE — Progress Notes (Signed)
Dr. Leta Jungling made aware of CBG RECHECK RESULTS- 111- O.K. To go to SHORT STAY.

## 2012-09-08 LAB — CBC WITH DIFFERENTIAL/PLATELET
Basophils Absolute: 0 10*3/uL (ref 0.0–0.1)
Eosinophils Absolute: 0 10*3/uL (ref 0.0–0.7)
Eosinophils Relative: 0 % (ref 0–5)
HCT: 33.9 % — ABNORMAL LOW (ref 36.0–46.0)
Lymphocytes Relative: 9 % — ABNORMAL LOW (ref 12–46)
MCH: 26.5 pg (ref 26.0–34.0)
MCV: 81.7 fL (ref 78.0–100.0)
Monocytes Absolute: 0.9 10*3/uL (ref 0.1–1.0)
RDW: 15.1 % (ref 11.5–15.5)
WBC: 10 10*3/uL (ref 4.0–10.5)

## 2012-09-08 LAB — CBC
HCT: 25.9 % — ABNORMAL LOW (ref 36.0–46.0)
HCT: 23.8 % — ABNORMAL LOW (ref 36.0–46.0)
Hemoglobin: 8.5 g/dL — ABNORMAL LOW (ref 12.0–15.0)
Hemoglobin: 7.4 g/dL — ABNORMAL LOW (ref 12.0–15.0)
MCHC: 31.1 g/dL (ref 30.0–36.0)
MCV: 83.2 fL (ref 78.0–100.0)
RBC: 3.14 MIL/uL — ABNORMAL LOW (ref 3.87–5.11)
WBC: 5.5 10*3/uL (ref 4.0–10.5)

## 2012-09-08 LAB — BASIC METABOLIC PANEL
BUN: 12 mg/dL (ref 6–23)
Chloride: 104 mEq/L (ref 96–112)
GFR calc Af Amer: >90 mL/min (ref >90)
Glucose, Bld: 242 mg/dL — ABNORMAL HIGH (ref 70–99)
Potassium: 4.4 mEq/L (ref 3.5–5.1)

## 2012-09-08 LAB — TYPE AND SCREEN: Antibody Screen: NEGATIVE

## 2012-09-08 LAB — HEPATIC FUNCTION PANEL
Albumin: 3 g/dL — ABNORMAL LOW (ref 3.5–5.2)
Total Protein: 7.2 g/dL (ref 6.0–8.3)

## 2012-09-08 LAB — GLUCOSE, CAPILLARY
Glucose-Capillary: 143 mg/dL — ABNORMAL HIGH (ref 70–99)
Glucose-Capillary: 258 mg/dL — ABNORMAL HIGH (ref 70–99)

## 2012-09-08 LAB — MRSA PCR SCREENING: MRSA by PCR: NEGATIVE

## 2012-09-08 MED ORDER — HYDROCODONE-ACETAMINOPHEN 5-325 MG PO TABS
1.0000 | ORAL_TABLET | ORAL | Status: DC | PRN
  Administered 2012-09-08 – 2012-09-10 (×5): 1 via ORAL
  Administered 2012-09-11: 2 via ORAL
  Filled 2012-09-08 (×2): qty 1
  Filled 2012-09-08: qty 2
  Filled 2012-09-08 (×3): qty 1

## 2012-09-08 MED ORDER — INSULIN ASPART 100 UNIT/ML ~~LOC~~ SOLN
8.0000 [IU] | Freq: Once | SUBCUTANEOUS | Status: AC
  Administered 2012-09-08: 8 [IU] via SUBCUTANEOUS

## 2012-09-08 MED ORDER — HYDROMORPHONE HCL PF 1 MG/ML IJ SOLN
INTRAMUSCULAR | Status: AC
  Filled 2012-09-08: qty 1

## 2012-09-08 MED ORDER — SODIUM CHLORIDE 0.9 % IV SOLN
INTRAVENOUS | Status: DC
  Administered 2012-09-08 – 2012-09-09 (×3): via INTRAVENOUS

## 2012-09-08 MED ORDER — INSULIN ASPART 100 UNIT/ML ~~LOC~~ SOLN
0.0000 [IU] | Freq: Three times a day (TID) | SUBCUTANEOUS | Status: DC
  Administered 2012-09-08: 2 [IU] via SUBCUTANEOUS
  Administered 2012-09-08: 17:00:00 via SUBCUTANEOUS
  Administered 2012-09-09: 5 [IU] via SUBCUTANEOUS
  Administered 2012-09-09: 8 [IU] via SUBCUTANEOUS
  Administered 2012-09-09: 2 [IU] via SUBCUTANEOUS
  Administered 2012-09-10 (×3): 8 [IU] via SUBCUTANEOUS

## 2012-09-08 MED ORDER — CEFAZOLIN SODIUM 1-5 GM-% IV SOLN
1.0000 g | Freq: Three times a day (TID) | INTRAVENOUS | Status: DC
  Administered 2012-09-09 – 2012-09-10 (×5): 1 g via INTRAVENOUS
  Filled 2012-09-08 (×7): qty 50

## 2012-09-08 MED ORDER — INSULIN ASPART 100 UNIT/ML ~~LOC~~ SOLN
0.0000 [IU] | SUBCUTANEOUS | Status: DC
  Administered 2012-09-08: 5 [IU] via SUBCUTANEOUS

## 2012-09-08 MED ORDER — HYDROMORPHONE HCL PF 1 MG/ML IJ SOLN
0.5000 mg | INTRAMUSCULAR | Status: DC | PRN
  Administered 2012-09-08 – 2012-09-09 (×3): 0.5 mg via INTRAVENOUS
  Filled 2012-09-08 (×2): qty 1

## 2012-09-08 NOTE — H&P (Signed)
History and Physical  Chief Complaint: Suprapubic pain.  Gross hematuria.  Vaginal bleeding.  History of Present Illness: The patient is a 57 years old female who had TURBT and biopsy of cervix on 3/28 for bladder tumor and pelvic mass.  She started having suprapubic pain around 5 PM.  She was unable to void and was also bleeding through the vagina. She felt that she was going to pass out.  She came to the ER.  A Foley catheter inserted in the bladder drained grossly bloody urine.  She also had vaginal bleeding.  She has a history of thrombocytopenia.  Hemoglobin is 11.0, Hematocrit: 33.9 Platelets:89, INR: 1.34 BUN: 11  Creatinine: 0.60  Past Medical History  Diagnosis Date  . Diabetes mellitus   . Hypertension   . Heart palpitations   . Anxiety   . Depression   . Bronchitis   . GERD (gastroesophageal reflux disease)     hx of  . Arthritis     shoulder  . Hepatitis     hepatitis C  . Diabetic neuropathy    Past Surgical History  Procedure Laterality Date  . Tonsillectomy    . Tubal ligation    . Cystostomy w/ bladder biopsy    . Cervical biopsy      Medications: Lantus insulin, Novolin Lisinopril, metformin, tramadol Allergies: No Known Allergies  No family history on file. Social History:  reports that she quit smoking about 12 years ago. Her smoking use included Cigarettes. She has a 5 pack-year smoking history. She has never used smokeless tobacco. 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 She reports that she does not drink alcohol.  ROS: All systems are reviewed and negative except as noted.   Physical Exam:  Vital signs in last 24 hours: Temp:  [97.3 F (36.3 C)-98.5 F (36.9 C)] 98.5 F (36.9 C) (03/29 0330) Pulse Rate:  [73-100] 98 (03/29 0600) Resp:  [14-20] 18 (03/29 0600) BP: (92-150)/(51-91) 114/65 mmHg (03/29 0600) SpO2:  [93 %-100 %] 100 % (03/29 0600) Weight:  [140 lb 6.9 oz (63.7 kg)] 140 lb 6.9 oz (63.7  kg) (03/29 0330) HEENT: Normal.  No cervical adenopathy.  No thyromegaly Cardiovascular: Skin warm; not flushed Respiratory: Breaths quiet; no shortness of breath Abdomen: No masses Neurological: Normal sensation to touch Musculoskeletal: Normal motor function arms and legs Lymphatics: No inguinal adenopathy Skin: No rashes Genitourinary:Normal female genitalia.  Vaginal bleeding.  Laboratory Data:  Results for orders placed during the hospital encounter of 09/07/12 (from the past 24 hour(s))  CBC WITH DIFFERENTIAL     Status: Abnormal   Collection Time    09/07/12 11:15 PM      Result Value Range   WBC 10.0  4.0 - 10.5 K/uL   RBC 4.15  3.87 - 5.11 MIL/uL   Hemoglobin 11.0 (*) 12.0 - 15.0 g/dL   HCT 09.8 (*) 11.9 - 14.7 %   MCV 81.7  78.0 - 100.0 fL   MCH 26.5  26.0 - 34.0 pg   MCHC 32.4  30.0 - 36.0 g/dL   RDW 82.9  56.2 - 13.0 %   Platelets 89 (*) 150 - 400 K/uL   Neutrophils Relative 82 (*) 43 - 77 %   Neutro Abs 8.2 (*) 1.7 - 7.7 K/uL   Lymphocytes Relative 9 (*) 12 - 46 %   Lymphs Abs 0.9  0.7 - 4.0 K/uL   Monocytes Relative 9  3 - 12 %  Monocytes Absolute 0.9  0.1 - 1.0 K/uL   Eosinophils Relative 0  0 - 5 %   Eosinophils Absolute 0.0  0.0 - 0.7 K/uL   Basophils Relative 0  0 - 1 %   Basophils Absolute 0.0  0.0 - 0.1 K/uL  BASIC METABOLIC PANEL     Status: Abnormal   Collection Time    09/07/12 11:15 PM      Result Value Range   Sodium 135  135 - 145 mEq/L   Potassium 3.8  3.5 - 5.1 mEq/L   Chloride 100  96 - 112 mEq/L   CO2 26  19 - 32 mEq/L   Glucose, Bld 296 (*) 70 - 99 mg/dL   BUN 11  6 - 23 mg/dL   Creatinine, Ser 0.98  0.50 - 1.10 mg/dL   Calcium 8.8  8.4 - 11.9 mg/dL   GFR calc non Af Amer >90  >90 mL/min   GFR calc Af Amer >90  >90 mL/min  PROTIME-INR     Status: Abnormal   Collection Time    09/07/12 11:15 PM      Result Value Range   Prothrombin Time 16.3 (*) 11.6 - 15.2 seconds   INR 1.34  0.00 - 1.49  APTT     Status: None   Collection Time     09/07/12 11:15 PM      Result Value Range   aPTT 33  24 - 37 seconds  TYPE AND SCREEN     Status: None   Collection Time    09/07/12 11:22 PM      Result Value Range   ABO/RH(D) A POS     Antibody Screen NEG     Sample Expiration 09/10/2012    HEPATIC FUNCTION PANEL     Status: Abnormal   Collection Time    09/08/12 12:08 AM      Result Value Range   Total Protein 7.2  6.0 - 8.3 g/dL   Albumin 3.0 (*) 3.5 - 5.2 g/dL   AST 74 (*) 0 - 37 U/L   ALT 71 (*) 0 - 35 U/L   Alkaline Phosphatase 88  39 - 117 U/L   Total Bilirubin 0.8  0.3 - 1.2 mg/dL   Bilirubin, Direct 0.4 (*) 0.0 - 0.3 mg/dL   Indirect Bilirubin 0.4  0.3 - 0.9 mg/dL  CBC     Status: Abnormal   Collection Time    09/08/12  6:03 AM      Result Value Range   WBC 5.5  4.0 - 10.5 K/uL   RBC 3.14 (*) 3.87 - 5.11 MIL/uL   Hemoglobin 8.5 (*) 12.0 - 15.0 g/dL   HCT 14.7 (*) 82.9 - 56.2 %   MCV 82.5  78.0 - 100.0 fL   MCH 27.1  26.0 - 34.0 pg   MCHC 32.8  30.0 - 36.0 g/dL   RDW 13.0  86.5 - 78.4 %   Platelets 64 (*) 150 - 400 K/uL  BASIC METABOLIC PANEL     Status: Abnormal   Collection Time    09/08/12  6:03 AM      Result Value Range   Sodium 135  135 - 145 mEq/L   Potassium 4.4  3.5 - 5.1 mEq/L   Chloride 104  96 - 112 mEq/L   CO2 26  19 - 32 mEq/L   Glucose, Bld 242 (*) 70 - 99 mg/dL   BUN 12  6 - 23 mg/dL   Creatinine,  Ser 0.60  0.50 - 1.10 mg/dL   Calcium 7.9 (*) 8.4 - 10.5 mg/dL   GFR calc non Af Amer >90  >90 mL/min   GFR calc Af Amer >90  >90 mL/min  PROTIME-INR     Status: Abnormal   Collection Time    09/08/12  6:03 AM      Result Value Range   Prothrombin Time 16.9 (*) 11.6 - 15.2 seconds   INR 1.41  0.00 - 1.49  ABO/RH     Status: None   Collection Time    09/08/12 11:22 PM      Result Value Range   ABO/RH(D) A POS     Recent Results (from the past 240 hour(s))  SURGICAL PCR SCREEN     Status: None   Collection Time    08/31/12 12:19 PM      Result Value Range Status   MRSA, PCR  NEGATIVE  NEGATIVE Final   Staphylococcus aureus NEGATIVE  NEGATIVE Final   Comment:            The Xpert SA Assay (FDA     approved for NASAL specimens     in patients over 57 years of age),     is one component of     a comprehensive surveillance     program.  Test performance has     been validated by [employer] for patients greater     than or equal to 77 year old.     It is not intended     to diagnose infection nor to     guide or monitor treatment.   Creatinine:  Recent Labs  09/07/12 2315 09/08/12 0603  CREATININE 0.60 0.60    Xrays: See report/chart   Impression/Assessment:  Gross hematuria.  Vaginal bleeding.  S/P TURBT and cervical biopsy.  Diabetes, H/o thrombocytopenia  Plan:  Admit for observation.  Repeat H&H in AM  [name] 09/08/2012, 8:27 AM

## 2012-09-08 NOTE — Progress Notes (Signed)
UR completed 

## 2012-09-08 NOTE — Progress Notes (Signed)
  Subjective: Patient reports moderate suprapubic pain. No nausea.  Feels hungry  Objective: Vital signs in last 24 hours: Temp:  [97.3 F (36.3 C)-98.5 F (36.9 C)] 98.5 F (36.9 C) (03/29 0330) Pulse Rate:  [73-100] 98 (03/29 0600) Resp:  [14-20] 18 (03/29 0600) BP: (92-150)/(51-91) 114/65 mmHg (03/29 0600) SpO2:  [93 %-100 %] 100 % (03/29 0600) Weight:  [140 lb 6.9 oz (63.7 kg)] 140 lb 6.9 oz (63.7 kg) (03/29 0330)  Intake/Output from previous day: 03/28 0701 - 03/29 0700 In: 500 [I.V.:500] Out: 4095 [Urine:1920] Intake/Output this shift:    Physical Exam:  Alert and oriented Lungs - Normal respiratory effort, chest expands symmetrically.  Abdomen - Soft, generalized tenderness.  No rebound.  No abdominal mass. Pelvic examination: tender, small amount of blood on the gloved fingers. Lab Results:  Recent Labs  09/07/12 2315 09/08/12 0603  HGB 11.0* 8.5*  HCT 33.9* 25.9*   BMET  Recent Labs  09/07/12 2315 09/08/12 0603  NA 135 135  K 3.8 4.4  CL 100 104  CO2 26 26  GLUCOSE 296* 242*  BUN 11 12  CREATININE 0.60 0.60  CALCIUM 8.8 7.9*    Recent Labs  09/07/12 2315 09/08/12 0603  INR 1.34 1.41   No results found for this basename: LABURIN,  in the last 72 hours Results for orders placed during the hospital encounter of 08/31/12  SURGICAL PCR SCREEN     Status: None   Collection Time    08/31/12 12:19 PM      Result Value Range Status   MRSA, PCR NEGATIVE  NEGATIVE Final   Staphylococcus aureus NEGATIVE  NEGATIVE Final   Comment:            The Xpert SA Assay (FDA     approved for NASAL specimens     in patients over 43 years of age),     is one component of     a comprehensive surveillance     program.  Test performance has     been validated by [employer] for patients greater     than or equal to 3 year old.     It is not intended     to diagnose infection nor to     guide or monitor treatment.    Studies/Results: Dg Retrograde  Pyelogram  09/07/2012  *RADIOLOGY REPORT*  Clinical Data: Bladder  RETROGRADE PYELOGRAM  Comparison: CT 08/27/2012  Findings: Bilateral retrograde pyelograms demonstrate no filling defects within the ureters. Contrast refluxes to the renal pelves. Large uterine fibroid noted  IMPRESSION: No filling defects on retrograde pyelograms.   Original Report Authenticated By: Genevive Bi, M.D.    Dg Retrograde Pyelogram  09/07/2012  *RADIOLOGY REPORT*  Clinical Data: Bladder  RETROGRADE PYELOGRAM  Comparison: CT 08/27/2012  Findings: Bilateral retrograde pyelograms demonstrate no filling defects within the ureters. Contrast refluxes to the renal pelves. Large uterine fibroid noted  IMPRESSION: No filling defects on retrograde pyelograms.   Original Report Authenticated By: Genevive Bi, M.D.     Assessment/Plan:  Gross hematuria. Vaginal bleeding  CBI.  Recheck H&H this afternoon.  Transfuse if hemoglobin and hematocrit drift below 7 and/or if patient is not stable  Dilaudid for pain   LOS: 1 day   [name] 09/08/2012, 8:40 AM

## 2012-09-08 NOTE — Op Note (Deleted)
[name], [name]                ACCOUNT NO.:  [id number]  MEDICAL RECORD NO.:  [id number]  LOCATION:  [id number]                         FACILITY:  [facility]  PHYSICIAN:  Sebastian Ache, MD     DATE OF BIRTH:  08-29-1955  DATE OF PROCEDURE:  09/07/2012 DATE OF DISCHARGE:                              OPERATIVE REPORT   PREOPERATIVE DIAGNOSES:  Hematuria, bladder erythema, pelvic mass, cervical erythema.  PROCEDURE: 1. Cystoscopy with bladder biopsy and fulguration. 2. Bilateral retrograde pyelogram interpretation. 3. Biopsy of the cervix.  ESTIMATED BLOOD LOSS:  Nil.  FINDINGS: 1. Diffuse bladder erythema, apparent extrinsic compression.  Area of     erythema at the trigone, right wall was biopsied. 2. Unremarkable right retrograde pyelogram. 3. Right retrograde pyelogram with lateral displacement of right     ureter. 4. __________ with very firm suspect pelvic gynecologic mass that is     not fixed. 5. Cervical erythema with abnormal mucosal contour.  Small area was     biopsied at 10 o'clock position.  SPECIMENS: 1. Bladder biopsy. 2. Cervical biopsy.  INDICATION:  Ms. Massingale is a very pleasant 57 year old lady with recent history of microscopic and gross hematuria.  She is referred for evaluation.  CT scan revealed a complex possible pelvic mass with diffuse enhancement of her posterior bladder as well as her uterus as well as uterine fibroids.  Office cystoscopy corroborated possible intraluminal bladder mass with diffuse erythema.  It was worrisome for possible primary gynecologic versus urologic malignancy, felt that further staging evaluation with biopsy was indicated.  Informed consent was obtained and placed in medical record.  PROCEDURE IN DETAIL:  The patient being [name] was verified. Procedure being cystoscopy with bladder biopsy was confirmed. Anesthesia was performed.  Procedure was carried out.  Time-out was performed.  Intravenous antibiotics  administered.  General LMA anesthesia was introduced.  The patient was placed into a low lithotomy position.  Sterile field was created by prepping and draping the patient's vagina, introitus, and proximal thighs using iodine x3.  Next, cystourethroscopy was performed using a 22-French rigid cystoscope with 12-degree offset lens.  Inspection of bladder revealed erythema most dense along the posterior wall and right wall.  The right ureteral orifice was somewhat laterally displaced with apparent extrinsic compression with no obvious large intraluminal bladder mass.  Next, attention was directed to the retrograde pyelogram.  The left ureteral orifice gently cannulated using a 6-French end-hole catheter and left retrograde pyelogram seen.  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  No filling defects or narrowing noted.  Next, right retrograde pyelogram was seen.  Right retrograde pyelogram reveals a single right ureter single system right kidney.  There was lateral splaying of the distal ureter.  Some mild caliectasis.  Next, the cystoscope was exchanged for a 26-French ACMI gyrus resectoscope and using a medium loop.  Representative biopsies were taken of the densest erythema at the trigone as well as the right wall.  This was set aside for permanent pathology.  Repeat inspection revealed no evidence for bladder perforation and excellent hemostasis.  The same kind of procedure was then performed and there was  a large palpable pelvic mass, it was not fixed.  Next, vaginal speculum was introduced for inspection of the cervix.  Cervix was noted to be erythematous, friable, without overt mass.  There was some very small mucosal nodularity.  A tiny ellipse approximately 5 mm in diameter was taken as a biopsy from the 10 o'clock position using long forceps with a cautery and set aside separately for permanent, labeled cervical biopsy. Procedure was  then terminated.  The patient tolerated the procedure well.  There were no immediate periprocedural complications.  The patient was taken to the postanesthesia care unit in stable condition.          ______________________________ Sebastian Ache, MD     TM/MEDQ  D:  09/07/2012  T:  09/08/2012  Job:  [id number]

## 2012-09-08 NOTE — Op Note (Deleted)
NAME:  Mackert, Cristan                ACCOUNT NO.:  [id number]  MEDICAL RECORD NO.:  [id number]  LOCATION:  [id number]                         FACILITY:  [facility]  PHYSICIAN:  Eric Nees, MD     DATE OF BIRTH:  11/04/1955  DATE OF PROCEDURE:  09/07/2012 DATE OF DISCHARGE:                              OPERATIVE REPORT   PREOPERATIVE DIAGNOSES:  Hematuria, bladder erythema, pelvic mass, cervical erythema.  PROCEDURE: 1. Cystoscopy with bladder biopsy and fulguration. 2. Bilateral retrograde pyelogram interpretation. 3. Biopsy of the cervix.  ESTIMATED BLOOD LOSS:  Nil.  FINDINGS: 1. Diffuse bladder erythema, apparent extrinsic compression.  Area of     erythema at the trigone, right wall was biopsied. 2. Unremarkable right retrograde pyelogram. 3. Right retrograde pyelogram with lateral displacement of right     ureter. 4. Exam under andesthesia with very firm suspect pelvic gynecologic mass that is     not fixed. 5. Cervical erythema with abnormal mucosal contour.  Small area was     biopsied at 10 o'clock position.  SPECIMENS: 1. Bladder biopsy. 2. Cervical biopsy.  INDICATION:  Ms. Foushee is a very pleasant 57-year-old lady with recent history of microscopic and gross hematuria.  She is referred for evaluation.  CT scan revealed a complex possible pelvic mass with diffuse enhancement of her posterior bladder as well as her uterus as well as uterine fibroids.  Office cystoscopy corroborated possible intraluminal bladder mass with diffuse erythema.  It was worrisome for possible primary gynecologic versus urologic malignancy, felt that further staging evaluation with biopsy was indicated.  Informed consent was obtained and placed in medical record.  PROCEDURE IN DETAIL:  The patient being Giuliana Andes was verified. Procedure being cystoscopy with bladder biopsy was confirmed. Anesthesia was performed.  Procedure was carried out.  Time-out was performed.  Intravenous antibiotics  administered.  General LMA anesthesia was introduced.  The patient was placed into a low lithotomy position.  Sterile field was created by prepping and draping the patient's vagina, introitus, and proximal thighs using iodine x3.  Next, cystourethroscopy was performed using a 22-French rigid cystoscope with 12-degree offset lens.  Inspection of bladder revealed erythema most dense along the posterior wall and right wall.  The right ureteral orifice was somewhat laterally displaced with apparent extrinsic compression with no obvious large intraluminal bladder mass.  Next, attention was directed to the retrograde pyelogram.  The left ureteral orifice gently cannulated using a 6-French end-hole catheter and left retrograde pyelogram seen.  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  No filling defects or narrowing noted.  Next, right retrograde pyelogram was seen.  Right retrograde pyelogram reveals a single right ureter single system right kidney.  There was lateral splaying of the distal ureter.  Some mild caliectasis.  Next, the cystoscope was exchanged for a 26-French ACMI gyrus resectoscope and using a medium loop.  Representative biopsies were taken of the densest erythema at the trigone as well as the right wall.  This was set aside for permanent pathology.  Repeat inspection revealed no evidence for bladder perforation and excellent hemostasis.  The same kind of procedure was then performed and   there was a large palpable pelvic mass, it was not fixed.  Next, vaginal speculum was introduced for inspection of the cervix.  Cervix was noted to be erythematous, friable, without overt mass.  There was some very small mucosal nodularity.  A tiny ellipse approximately 5 mm in diameter was taken as a biopsy from the 10 o'clock position using long forceps with a cautery and set aside separately for permanent, labeled cervical biopsy. Procedure was  then terminated. Hemostasis appeared excellent. The patient tolerated the procedure well.  There were no immediate periprocedural complications.  The patient was taken to the postanesthesia care unit in stable condition.          ______________________________ Mccormick Macon, MD     TM/MEDQ  D:  09/07/2012  T:  09/08/2012  Job:  [id number] 

## 2012-09-08 NOTE — Op Note (Signed)
[name], [name]                ACCOUNT NO.:  [id number]  MEDICAL RECORD NO.:  [id number]  LOCATION:  BM84                         FACILITY:  [facility]  PHYSICIAN:  Sebastian Ache, MD     DATE OF BIRTH:  14-Aug-1955  DATE OF PROCEDURE:  09/07/2012 DATE OF DISCHARGE:                              OPERATIVE REPORT   PREOPERATIVE DIAGNOSES:  Hematuria, bladder erythema, pelvic mass, cervical erythema.  PROCEDURE: 1. Cystoscopy with bladder biopsy and fulguration. 2. Bilateral retrograde pyelogram interpretation. 3. Biopsy of the cervix.  ESTIMATED BLOOD LOSS:  Nil.  FINDINGS: 1. Diffuse bladder erythema, apparent extrinsic compression.  Area of     erythema at the trigone, right wall was biopsied. 2. Unremarkable right retrograde pyelogram. 3. Right retrograde pyelogram with lateral displacement of right     ureter. 4. Exam under andesthesia with very firm suspect pelvic gynecologic mass that is     not fixed. 5. Cervical erythema with abnormal mucosal contour.  Small area was     biopsied at 10 o'clock position.  SPECIMENS: 1. Bladder biopsy. 2. Cervical biopsy.  INDICATION:  Ms. Garraway is a very pleasant 57 year old lady with recent history of microscopic and gross hematuria.  She is referred for evaluation.  CT scan revealed a complex possible pelvic mass with diffuse enhancement of her posterior bladder as well as her uterus as well as uterine fibroids.  Office cystoscopy corroborated possible intraluminal bladder mass with diffuse erythema.  It was worrisome for possible primary gynecologic versus urologic malignancy, felt that further staging evaluation with biopsy was indicated.  Informed consent was obtained and placed in medical record.  PROCEDURE IN DETAIL:  The patient being [name] was verified. Procedure being cystoscopy with bladder biopsy was confirmed. Anesthesia was performed.  Procedure was carried out.  Time-out was performed.  Intravenous antibiotics  administered.  General LMA anesthesia was introduced.  The patient was placed into a low lithotomy position.  Sterile field was created by prepping and draping the patient's vagina, introitus, and proximal thighs using iodine x3.  Next, cystourethroscopy was performed using a 22-French rigid cystoscope with 12-degree offset lens.  Inspection of bladder revealed erythema most dense along the posterior wall and right wall.  The right ureteral orifice was somewhat laterally displaced with apparent extrinsic compression with no obvious large intraluminal bladder mass.  Next, attention was directed to the retrograde pyelogram.  The left ureteral orifice gently cannulated using a 6-French end-hole catheter and left retrograde pyelogram seen.  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  No filling defects or narrowing noted.  Next, right retrograde pyelogram was seen.  Right retrograde pyelogram reveals a single right ureter single system right kidney.  There was lateral splaying of the distal ureter.  Some mild caliectasis.  Next, the cystoscope was exchanged for a 26-French ACMI gyrus resectoscope and using a medium loop.  Representative biopsies were taken of the densest erythema at the trigone as well as the right wall.  This was set aside for permanent pathology.  Repeat inspection revealed no evidence for bladder perforation and excellent hemostasis.  The same kind of procedure was then performed and  there was a large palpable pelvic mass, it was not fixed.  Next, vaginal speculum was introduced for inspection of the cervix.  Cervix was noted to be erythematous, friable, without overt mass.  There was some very small mucosal nodularity.  A tiny ellipse approximately 5 mm in diameter was taken as a biopsy from the 10 o'clock position using long forceps with a cautery and set aside separately for permanent, labeled cervical biopsy. Procedure was  then terminated. Hemostasis appeared excellent. The patient tolerated the procedure well.  There were no immediate periprocedural complications.  The patient was taken to the postanesthesia care unit in stable condition.          ______________________________ Sebastian Ache, MD     TM/MEDQ  D:  09/07/2012  T:  09/08/2012  Job:  [id number]

## 2012-09-09 LAB — GLUCOSE, CAPILLARY
Glucose-Capillary: 165 mg/dL — ABNORMAL HIGH (ref 70–99)
Glucose-Capillary: 289 mg/dL — ABNORMAL HIGH (ref 70–99)

## 2012-09-09 LAB — CBC
HCT: 28.3 % — ABNORMAL LOW (ref 36.0–46.0)
Hemoglobin: 9.2 g/dL — ABNORMAL LOW (ref 12.0–15.0)
MCH: 26.8 pg (ref 26.0–34.0)
RBC: 3.43 MIL/uL — ABNORMAL LOW (ref 3.87–5.11)

## 2012-09-09 MED ORDER — OXYBUTYNIN CHLORIDE 5 MG PO TABS
5.0000 mg | ORAL_TABLET | Freq: Three times a day (TID) | ORAL | Status: DC
  Administered 2012-09-09 – 2012-09-10 (×5): 5 mg via ORAL
  Filled 2012-09-09 (×8): qty 1

## 2012-09-09 MED ORDER — DOCUSATE SODIUM 100 MG PO CAPS
100.0000 mg | ORAL_CAPSULE | Freq: Two times a day (BID) | ORAL | Status: DC
  Administered 2012-09-09 – 2012-09-11 (×4): 100 mg via ORAL
  Filled 2012-09-09 (×6): qty 1

## 2012-09-09 MED ORDER — BISACODYL 10 MG RE SUPP
10.0000 mg | Freq: Every day | RECTAL | Status: DC | PRN
  Administered 2012-09-09: 10 mg via RECTAL
  Filled 2012-09-09: qty 1

## 2012-09-09 NOTE — Progress Notes (Signed)
Dr Brunilda Payor called concerning patients c/o pain, cramping in lower abdomen. I spoke with Charge RN Cicero Duck on 4E about patients foley which was irrigated with good return, question "bladder Spasms"? New orders given will continue to monitor.

## 2012-09-09 NOTE — Progress Notes (Signed)
INITIAL NUTRITION ASSESSMENT  DOCUMENTATION CODES Per approved criteria  -Not Applicable   INTERVENTION: - Will provide snacks per preference TID - Encourage adequate intake.   NUTRITION DIAGNOSIS: Predicted suboptimal oral intake related to abdominal pain as evidenced by recent history of poor intake.   Goal: Patient will meet >/=90% of estimated nutrition needs.  Monitor:  PO intake, weight, labs  Reason for Assessment: Malnutrition screening tool, score of 2  57 y.o. female  Admitting Dx: Hematuria, vaginal bleeding  ASSESSMENT: Patient who underwent cervix biopsy, admitted with suprapubic pain and vaginal bleeding. She reports that she usually has a good appetite, but was decreased 3 days prior to admission. She reports following a diabetic diet, but states, she usually snacks throughout the day. Patient denies weight loss.  Height: Ht Readings from Last 1 Encounters:  09/09/12 5\' 4"  (1.626 m)    Weight: Wt Readings from Last 1 Encounters:  09/08/12 140 lb 6.9 oz (63.7 kg)    Ideal Body Weight: 54.5 kg  % Ideal Body Weight: 117%  Wt Readings from Last 10 Encounters:  09/08/12 140 lb 6.9 oz (63.7 kg)  08/31/12 137 lb (62.143 kg)    Usual Body Weight: Unknown  % Usual Body Weight:   BMI:  Body mass index is 24.09 kg/(m^2). Patient is normal weight.   Estimated Nutritional Needs: Kcal: 1600-1700 kcal Protein: 75-90 g Fluid: >2.2 L  Skin: Incision perineum  Diet Order: Carb Control, 75-90% intake  EDUCATION NEEDS: -No education needs identified at this time   Intake/Output Summary (Last 24 hours) at 09/09/12 1401 Last data filed at 09/09/12 1100  Gross per 24 hour  Intake   2277 ml  Output   5600 ml  Net  -3323 ml    Last BM: No BM in last 3 days   Labs:   Recent Labs Lab 09/07/12 2315 09/08/12 0603  NA 135 135  K 3.8 4.4  CL 100 104  CO2 26 26  BUN 11 12  CREATININE 0.60 0.60  CALCIUM 8.8 7.9*  GLUCOSE 296* 242*    CBG  (last 3)   Recent Labs  09/08/12 2156 09/09/12 0748 09/09/12 1206  GLUCAP 258* 165* 208*    Scheduled Meds: .  ceFAZolin (ANCEF) IV  1 g Intravenous Q8H  . docusate sodium  100 mg Oral BID  . insulin aspart  0-15 Units Subcutaneous TID WC    Continuous Infusions:   Past Medical History  Diagnosis Date  . Diabetes mellitus   . Hypertension   . Heart palpitations   . Anxiety   . Depression   . Bronchitis   . GERD (gastroesophageal reflux disease)     hx of  . Arthritis     shoulder  . Hepatitis     hepatitis C  . Diabetic neuropathy     Past Surgical History  Procedure Laterality Date  . Tonsillectomy    . Tubal ligation    . Cystostomy w/ bladder biopsy    . Cervical biopsy      Linnell Fulling, RD, LDN Pager #: [phone] After-Hours Pager #: [phone]

## 2012-09-09 NOTE — Progress Notes (Signed)
  Subjective: Patient reports Still c/o abdominal pain.  Has had abdominal pain for a while. No nausea. No BM for 3 days  Objective: Vital signs in last 24 hours: Temp:  [98.5 F (36.9 C)-100.4 F (38 C)] 99.8 F (37.7 C) (03/30 0400) Pulse Rate:  [86-115] 92 (03/30 0600) Resp:  [13-24] 18 (03/30 0600) BP: (90-138)/(46-79) 118/58 mmHg (03/30 0600) SpO2:  [95 %-100 %] 96 % (03/30 0600)  Intake/Output from previous day: 03/29 0701 - 03/30 0700 In: 2927 [P.O.:760; I.V.:2067; IV Piggyback:100] Out: 7200 [Urine:7200] Intake/Output this shift:    Physical Exam:   Lungs - Normal respiratory effort, chest expands symmetrically.  Abdomen - Soft,  non-distended. Foley draining well.  Urine grossly clear Lab Results:  Recent Labs  09/08/12 0603 09/08/12 1550 09/09/12 0344  HGB 8.5* 7.4* 9.2*  HCT 25.9* 23.8* 28.3*   BMET  Recent Labs  09/07/12 2315 09/08/12 0603  NA 135 135  K 3.8 4.4  CL 100 104  CO2 26 26  GLUCOSE 296* 242*  BUN 11 12  CREATININE 0.60 0.60  CALCIUM 8.8 7.9*    Recent Labs  09/07/12 2315 09/08/12 0603  INR 1.34 1.41   No results found for this basename: LABURIN,  in the last 72 hours Results for orders placed during the hospital encounter of 09/07/12  MRSA PCR SCREENING     Status: None   Collection Time    09/08/12  3:31 AM      Result Value Range Status   MRSA by PCR NEGATIVE  NEGATIVE Final   Comment:            The GeneXpert MRSA Assay (FDA     approved for NASAL specimens     only), is one component of a     comprehensive MRSA colonization     surveillance program. It is not     intended to diagnose MRSA     infection nor to guide or     monitor treatment for     MRSA infections.    Studies/Results: Dg Retrograde Pyelogram  09/07/2012  *RADIOLOGY REPORT*  Clinical Data: Bladder  RETROGRADE PYELOGRAM  Comparison: CT 08/27/2012  Findings: Bilateral retrograde pyelograms demonstrate no filling defects within the ureters.  Contrast refluxes to the renal pelves. Large uterine fibroid noted  IMPRESSION: No filling defects on retrograde pyelograms.   Original Report Authenticated By: Genevive Bi, M.D.    Dg Retrograde Pyelogram  09/07/2012  *RADIOLOGY REPORT*  Clinical Data: Bladder  RETROGRADE PYELOGRAM  Comparison: CT 08/27/2012  Findings: Bilateral retrograde pyelograms demonstrate no filling defects within the ureters. Contrast refluxes to the renal pelves. Large uterine fibroid noted  IMPRESSION: No filling defects on retrograde pyelograms.   Original Report Authenticated By: Genevive Bi, M.D.     Assessment/Plan:  Gross hematuria.  Anemia secondary to blood loss  D/C CBI  Transfer to floor.   LOS: 2 days   [name] 09/09/2012, 8:59 AM

## 2012-09-10 ENCOUNTER — Encounter (HOSPITAL_COMMUNITY): Payer: Self-pay | Admitting: Urology

## 2012-09-10 LAB — GLUCOSE, CAPILLARY
Glucose-Capillary: 252 mg/dL — ABNORMAL HIGH (ref 70–99)
Glucose-Capillary: 286 mg/dL — ABNORMAL HIGH (ref 70–99)

## 2012-09-10 LAB — BASIC METABOLIC PANEL
CO2: 26 mEq/L (ref 19–32)
Calcium: 8.2 mg/dL — ABNORMAL LOW (ref 8.4–10.5)
GFR calc non Af Amer: >90 mL/min (ref >90)
Glucose, Bld: 285 mg/dL — ABNORMAL HIGH (ref 70–99)
Potassium: 4.1 mEq/L (ref 3.5–5.1)
Sodium: 136 mEq/L (ref 135–145)

## 2012-09-10 LAB — CBC
Hemoglobin: 8.5 g/dL — ABNORMAL LOW (ref 12.0–15.0)
MCH: 26.9 pg (ref 26.0–34.0)
MCHC: 32.9 g/dL (ref 30.0–36.0)
Platelets: 61 10*3/uL — ABNORMAL LOW (ref 150–400)
RBC: 3.16 MIL/uL — ABNORMAL LOW (ref 3.87–5.11)

## 2012-09-10 MED ORDER — INSULIN ASPART 100 UNIT/ML ~~LOC~~ SOLN
0.0000 [IU] | Freq: Every day | SUBCUTANEOUS | Status: DC
  Administered 2012-09-10: 5 [IU] via SUBCUTANEOUS

## 2012-09-10 MED ORDER — INSULIN ASPART 100 UNIT/ML ~~LOC~~ SOLN
0.0000 [IU] | Freq: Three times a day (TID) | SUBCUTANEOUS | Status: DC
  Administered 2012-09-11: 15 [IU] via SUBCUTANEOUS
  Administered 2012-09-11: 8 [IU] via SUBCUTANEOUS

## 2012-09-10 NOTE — Progress Notes (Addendum)
Pt has been able to void twice since foley was D/C'd. However, each time was only dribbling. Working with PT at this time. When pt is done, will bladder scan.   1415: Bladder scan showed 354  1424: Called DR. Manny he said we can In&Out if needed, but encouraged pt to try to void on her own. Pt is now in bathroom with some water running and will take a few minutes to try to void on her own.

## 2012-09-10 NOTE — Progress Notes (Signed)
Pt still feeling a lot of urgency and frequency. Pt is voiding often, but only 75-186mLs at a time. MD is aware. Will continue to monitor.

## 2012-09-10 NOTE — Progress Notes (Signed)
Inpatient Diabetes Program Recommendations  AACE/ADA: New Consensus Statement on Inpatient Glycemic Control (2013)  Target Ranges:  Prepandial:   less than 140 mg/dL      Peak postprandial:   less than 180 mg/dL (1-2 hours)      Critically ill patients:  140 - 180 mg/dL   Reason for Visit: Hyperglycemia  Pt had been on Lantus 30 units and Novolog S/S at home.  Goes to Urgent Care for PCP.  Inpatient Diabetes Program Recommendations Insulin - Meal Coverage: Add Novolog 3 units tidwc if pt eats >50% meal  Will likely need Lantus to start (at reduced dose).  Thank you. Ailene Ards, RD, LDN, CDE Inpatient Diabetes Coordinator [phone]

## 2012-09-10 NOTE — Care Management (Signed)
Cm spoke with patient concerning discharge planning. Pt states dc plans include Home without HH services. PT eval recommends no further follow-up required. Pt states will require RW upon discharge. Will await MD orders if appropriate. No other needs stated.   Roxy Manns Haidy Kackley,RN,BSN [phone]

## 2012-09-10 NOTE — Progress Notes (Signed)
  Subjective:  1 - Pelvic Mass / Hematuria - s/p bladder biopsy / fulgeration and cervix biopsy 09/07/12. Final path benign. Still needs GYN eval for possible uterine malignancy v. Advanced fibroids.  Re-admitted night of 3/28 through ER for persistant hematuria and some vaginal bleeding. Known thrombocytopenia. Managed with bladder irrigation, DC'd 3/30. Trial of void 3/31.  2 - Disposition / Rehab - Pt with some trouble with ambulation at baseline. Had PT eval 3/31 that recs home without needs.  Today [name] is improving. Denies vaginal bleeding. Voiding on her own now some, through still with some small clots. Tollerating diet, pain controlled.  Objective: Vital signs in last 24 hours: Temp:  [98.2 F (36.8 C)-99.4 F (37.4 C)] 99.4 F (37.4 C) (03/31 1325) Pulse Rate:  [94-102] 102 (03/31 1325) Resp:  [16-18] 16 (03/31 1325) BP: (107-135)/(40-71) 107/40 mmHg (03/31 1325) SpO2:  [96 %-99 %] 99 % (03/31 1325) Last BM Date: 09/09/12  Intake/Output from previous day: 03/30 0701 - 03/31 0700 In: 1660 [P.O.:1410; I.V.:250] Out: 4325 [Urine:4325] Intake/Output this shift: Total I/O In: 480 [P.O.:480] Out: 175 [Urine:175]  General appearance: alert, cooperative and appears stated age Head: Normocephalic, without obvious abnormality, atraumatic Eyes: conjunctivae/corneas clear. PERRL, EOM's intact. Fundi benign. Ears: normal TM's and external ear canals both ears Nose: Nares normal. Septum midline. Mucosa normal. No drainage or sinus tenderness. Throat: lips, mucosa, and tongue normal; teeth and gums normal Neck: no adenopathy, no carotid bruit, no JVD, supple, symmetrical, trachea midline and thyroid not enlarged, symmetric, no tenderness/mass/nodules Back: symmetric, no curvature. ROM normal. No CVA tenderness. Resp: clear to auscultation bilaterally Cardio: regular rate and rhythm, S1, S2 normal, no murmur, click, rub or gallop GI: soft, non-tender; bowel sounds normal; no  masses,  no organomegaly Pelvic: external genitalia normal and liner in place without spotting, has been in all day. Extremities: extremities normal, atraumatic, no cyanosis or edema Pulses: 2+ and symmetric Skin: Skin color, texture, turgor normal. No rashes or lesions Lymph nodes: Cervical, supraclavicular, and axillary nodes normal. Neurologic: Grossly normal  Lab Results:   Recent Labs  09/09/12 0344 09/10/12 0409  WBC 6.5 4.4  HGB 9.2* 8.5*  HCT 28.3* 25.8*  PLT 74* 61*   BMET  Recent Labs  09/08/12 0603 09/10/12 0409  NA 135 136  K 4.4 4.1  CL 104 105  CO2 26 26  GLUCOSE 242* 285*  BUN 12 9  CREATININE 0.60 0.63  CALCIUM 7.9* 8.2*   PT/INR  Recent Labs  09/07/12 2315 09/08/12 0603  LABPROT 16.3* 16.9*  INR 1.34 1.41   ABG No results found for this basename: PHART, PCO2, PO2, HCO3,  in the last 72 hours  Studies/Results: No results found.  Anti-infectives: Anti-infectives   Start     Dose/Rate Route Frequency Ordered Stop   09/08/12 2315  ceFAZolin (ANCEF) IVPB 1 g/50 mL premix  Status:  Discontinued     1 g 100 mL/hr over 30 Minutes Intravenous 3 times per day 09/08/12 2307 09/10/12 0759      Assessment/Plan: 1 - Pelvic Mass / Hematuria - Hgb and plts acceptable. No sig ongoing clinical bleeding. Path reviewed with pt. Has GYN eval pending 4/2 which I stressed the importance of.  Continue trial of void and maintain PO hydration.   2 - Disposition / Rehab - Plan for DC AM.  [name], [name] 09/10/2012

## 2012-09-10 NOTE — Progress Notes (Signed)
Pt was able to void , pt had clot that she was able to pass. Pain has come down to a 4/10 and pt does not want anymore pain medicine. Encouraged pt to continue to attempt to void on her own. Will continue to monitor.

## 2012-09-10 NOTE — Progress Notes (Signed)
Pt IV looks infiltrated and is hurting pt. Pt refused IV to be taken out, and wanted MD to look at it. MD looked at it this morning and is agreeable to taking IV out and leaving out. Will continue to monitor.

## 2012-09-10 NOTE — Evaluation (Signed)
Physical Therapy Evaluation Patient Details Name: [name] MRN: [id number] DOB: May 30, 1956 Today's Date: 09/10/2012 Time: 1335-1400 PT Time Calculation (min): 25 min  PT Assessment / Plan / Recommendation Clinical Impression  57 y.o. female who has h/o L ankle fx 05/23/12 and has been NWB, using crutches since then. She now is admitted with pain, vaginal bleeding and hematuria following a TURBT and biopsy of cervix on 3/28 for bladder tumor and pelvic mass. Pt ambulated 42' with RW, distance limited by bladder spasm pain. She would benefit from acute PT to maximize safety and independence with mobility.     PT Assessment  Patient needs continued PT services    Follow Up Recommendations  No PT follow up    Does the patient have the potential to tolerate intense rehabilitation      Barriers to Discharge        Equipment Recommendations  None recommended by PT    Recommendations for Other Services     Frequency Min 3X/week    Precautions / Restrictions Restrictions Weight Bearing Restrictions: Yes RLE Weight Bearing: Weight bearing as tolerated LLE Weight Bearing: Non weight bearing Other Position/Activity Restrictions: ankle fx 05/23/12   Pertinent Vitals/Pain **10/10 lower abdominal pain with walking RN aware, pain meds requested   HR 99, SaO2 99, BP 134/67*      Mobility  Bed Mobility Bed Mobility: Supine to Sit Supine to Sit: 6: Modified independent (Device/Increase time);With rails Transfers Transfers: Sit to Stand;Stand to Sit Sit to Stand: 4: Min guard;From bed Stand to Sit: 4: Min guard;To bed Ambulation/Gait Ambulation/Gait Assistance: 4: Min guard Ambulation Distance (Feet): 28 Feet Assistive device: Rolling walker Gait Pattern: Step-to pattern General Gait Details: Pt reports she is to be NWB LLE, she wears boot but appears to not fully adhere to NWB status. Distance limited by 10/10 bladder spasms pain.    Exercises     PT Diagnosis: Difficulty  walking;Acute pain  PT Problem List: Decreased activity tolerance;Decreased mobility;Pain PT Treatment Interventions: Gait training;Stair training;Functional mobility training   PT Goals Acute Rehab PT Goals PT Goal Formulation: With patient Time For Goal Achievement: 09/10/12 Potential to Achieve Goals: Good Pt will go Sit to Stand: with modified independence PT Goal: Sit to Stand - Progress: Goal set today Pt will Ambulate: 51 - 150 feet;with modified independence PT Goal: Ambulate - Progress: Goal set today Pt will Go Up / Down Stairs: 6-9 stairs;with modified independence;with rail(s) PT Goal: Up/Down Stairs - Progress: Goal set today  Visit Information  Last PT Received On: 09/10/12 Assistance Needed: +1    Subjective Data  Subjective: I'm used to being able to go, I'm tired of being in pain.  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 mother) Available Help at Discharge: Available PRN/intermittently;Other (Comment) (daughter works) Type of Home: House Home Access: Stairs to enter Secretary/administrator of Steps: 5 Home Layout: Two level;Bed/bath upstairs Alternate Level Stairs-Number of Steps: 9 Alternate Level Stairs-Rails: Right Bathroom Shower/Tub: Engineer, manufacturing systems: Standard Home Adaptive Equipment: Crutches Prior Function Level of Independence: Independent with assistive device(s) Able to Take Stairs?: Yes Driving: Yes Vocation: Full time employment Comments: used crutches 2* L ankle fx in December Communication Communication: No difficulties    Cognition  Cognition Overall Cognitive Status: Appears within functional limits for tasks assessed/performed Arousal/Alertness: Awake/alert Orientation Level: Appears intact for tasks assessed Behavior During Session:  WFL for tasks performed    Extremity/Trunk Assessment Right Upper  Extremity Assessment RUE ROM/Strength/Tone: [facility] for tasks assessed Left Upper Extremity Assessment LUE ROM/Strength/Tone: WFL for tasks assessed Right Lower Extremity Assessment RLE ROM/Strength/Tone: Within functional levels RLE Sensation: WFL - Light Touch RLE Coordination: WFL - gross/fine motor Left Lower Extremity Assessment LLE ROM/Strength/Tone: Deficits LLE ROM/Strength/Tone Deficits: L ankle in boot 2* fx, knee/hip WFL LLE Sensation: WFL - Light Touch LLE Coordination: WFL - gross/fine motor Trunk Assessment Trunk Assessment: Normal   Balance    End of Session PT - End of Session Equipment Utilized During Treatment: Gait belt Activity Tolerance: Patient limited by pain Patient left: in bed;with call bell/phone within reach;with nursing in room  GP     Ralene Bathe Kistler 09/10/2012, 2:11 PM [phone]

## 2012-09-11 LAB — GLUCOSE, CAPILLARY: Glucose-Capillary: 253 mg/dL — ABNORMAL HIGH (ref 70–99)

## 2012-09-11 NOTE — Discharge Summary (Signed)
Physician Discharge Summary  Patient ID: [name] MRN: [id number] DOB/AGE: 1955-10-04 57 y.o.  Admit date: 09/07/2012 Discharge date: 09/11/2012  Admission Diagnoses: Hematuria, Postmenopausal vaginal bleeding, Pelvic Mass  Discharge Diagnoses: Hematuria, Postmenopausal vaginal bleeding, Pelvic Mass    Discharged Condition: good  Hospital Course:   Subjective:  1 - Pelvic Mass / Hematuria - s/p bladder biopsy / fulgeration and cervix biopsy 09/07/12 for complex pelvic mass with bladder compression / enhancement by CT as well as post-menaupasal vaginal bleeding. Final path benign. Still needs GYN eval for possible uterine malignancy v. Advanced fibroids. Re-admitted night of 3/28 through ER for persistant hematuria and some vaginal bleeding. Known thrombocytopenia. Managed with bladder irrigation, DC'd 3/30. Trial of void 3/31. By 4/1, pt voiding on own with clearing urine, tollerating diet, pain controlled, and felt adequate for discharge. Most recent Hgb 8.5.  2 - Disposition / Rehab - Pt with some trouble with ambulation at baseline. Had PT eval 3/31 that recs home with rolling walker. This has been RX'd at discharge.  Consults: None  Significant Diagnostic Studies: labs: Hgb 8.5  Treatments: IV hydration, bladder irrigation  Discharge Exam: Blood pressure 126/67, pulse 100, temperature 98.3 F (36.8 C), temperature source Oral, resp. rate 16, height 5\' 4"  (1.626 m), weight 63.7 kg (140 lb 6.9 oz), SpO2 96.00%. General appearance: alert, cooperative and appears stated age Head: Normocephalic, without obvious abnormality, atraumatic Eyes: conjunctivae/corneas clear. PERRL, EOM's intact. Fundi benign. Ears: normal TM's and external ear canals both ears Nose: Nares normal. Septum midline. Mucosa normal. No drainage or sinus tenderness. Throat: lips, mucosa, and tongue normal; teeth and gums normal Neck: no adenopathy, no carotid bruit, no JVD, supple, symmetrical, trachea midline  and thyroid not enlarged, symmetric, no tenderness/mass/nodules Back: symmetric, no curvature. ROM normal. No CVA tenderness. Resp: clear to auscultation bilaterally Chest wall: no tenderness Cardio: regular rate and rhythm, S1, S2 normal, no murmur, click, rub or gallop GI: soft, non-tender; bowel sounds normal; no masses,  no organomegaly Pelvic: external genitalia normal and vagina normal without discharge Extremities: extremities normal, atraumatic, no cyanosis or edema Pulses: 2+ and symmetric Skin: Skin color, texture, turgor normal. No rashes or lesions Lymph nodes: Cervical, supraclavicular, and axillary nodes normal. Neurologic: Grossly normal  Disposition: 01-Home or Self Care   Future Appointments Provider Department Dept Phone   09/12/2012 9:15 AM Annamaria Boots, MD [facility] [phone]       Medication List    TAKE these medications       insulin glargine 100 UNIT/ML injection  Commonly known as:  LANTUS  Inject 30 Units into the skin at bedtime.     lisinopril-hydrochlorothiazide 10-12.5 MG per tablet  Commonly known as:  PRINZIDE,ZESTORETIC  Take 1 tablet by mouth daily.     metFORMIN 1000 MG tablet  Commonly known as:  GLUCOPHAGE  Take 1,000 mg by mouth 2 (two) times daily with a meal.     multivitamin with minerals Tabs  Take 1 tablet by mouth daily.     phenazopyridine 100 MG tablet  Commonly known as:  PYRIDIUM  Take 1 tablet (100 mg total) by mouth 3 (three) times daily as needed for pain. (urinary burning post-op)     traMADol 50 MG tablet  Commonly known as:  ULTRAM  Take 2 tablets (100 mg total) by mouth every 8 (eight) hours as needed for pain.      ASK your doctor about these medications       insulin regular  100 units/mL injection  Commonly known as:  NOVOLIN R,HUMULIN R  Inject 5-20 Units into the skin daily as needed (per sliding scale). 200-220=5units if over 250 she does 20 units.Not a set rate she does this  on her own.           Follow-up Information   Follow up with Sebastian Ache, MD On 09/20/2012. (at 8AM)    Contact information:   [address] [place] [address] [phone]       Signed: Sebastian Ache 09/11/2012, 7:56 AM

## 2012-09-11 NOTE — Progress Notes (Signed)
Physical Therapy Treatment Patient Details Name: [name] MRN: [id number] DOB: April 25, 1956 Today's Date: 09/11/2012 Time: 6578-4696 PT Time Calculation (min): 25 min  PT Assessment / Plan / Recommendation Comments on Treatment Session  Pt plans to D/C to home today.  RW delivered and in room.  Adjusted to her height.  Instructed/demon proper gait with RW while maintaing NWB thru L LE (s/p ankle fx).  Verbally instructed on stairs as pt prefered to one R rail and one crutch.     Follow Up Recommendations        Does the patient have the potential to tolerate intense rehabilitation     Barriers to Discharge        Equipment Recommendations       Recommendations for Other Services    Frequency Min 3X/week   Plan      Precautions / Restrictions Restrictions Weight Bearing Restrictions: No   Pertinent Vitals/Pain C/o lower ABD pain/swelling/discomfort    Mobility  Bed Mobility Bed Mobility: Not assessed Details for Bed Mobility Assistance: Pt OOB in bathroom Transfers Transfers: Sit to Stand;Stand to Sit Sit to Stand: 6: Modified independent (Device/Increase time) Stand to Sit: 6: Modified independent (Device/Increase time) Details for Transfer Assistance: Good use of hands and increased time.  Ambulation/Gait Ambulation/Gait Assistance: 6: Modified independent (Device/Increase time) Ambulation Distance (Feet): 45 Feet Assistive device: Rolling walker Ambulation/Gait Assistance Details: RW delivered and in room.  Adjusted to pt's height..  Observed pt amb self out of bathroom w/o AD and PWB thru her L LE (s/p ankle fx with cam boot).  Re directed pt on NWB status and safety as she stated "Oh yea, I'm suppose to use the walker".   Also, instructed pt on proper gait using RW which included proper L LE positioning and proper R LE placement within RW for increased BOS/safety. Gait Pattern: Step-to pattern Gait velocity: WFL Stairs: Yes (Verbally/visually instructed pt on proper  asending and desen)     PT Goals                                     progressing    Visit Information  Last PT Received On: 09/11/12 Assistance Needed: +1    Subjective Data      Cognition    good   Balance   good  End of Session PT - End of Session Activity Tolerance: Patient tolerated treatment well Patient left: in chair;with call bell/phone within reach   Felecia Shelling  PTA [facility]  Acute  Rehab Pager      [phone]

## 2012-09-12 ENCOUNTER — Ambulatory Visit (INDEPENDENT_AMBULATORY_CARE_PROVIDER_SITE_OTHER): Payer: BC Managed Care – PPO | Admitting: Obstetrics & Gynecology

## 2012-09-12 ENCOUNTER — Encounter: Payer: Self-pay | Admitting: Obstetrics & Gynecology

## 2012-09-12 VITALS — BP 142/90 | HR 86 | Resp 18 | Ht 64.0 in | Wt 143.0 lb

## 2012-09-12 DIAGNOSIS — B1920 Unspecified viral hepatitis C without hepatic coma: Secondary | ICD-10-CM

## 2012-09-12 DIAGNOSIS — K7460 Unspecified cirrhosis of liver: Secondary | ICD-10-CM

## 2012-09-12 DIAGNOSIS — D259 Leiomyoma of uterus, unspecified: Secondary | ICD-10-CM

## 2012-09-12 DIAGNOSIS — D219 Benign neoplasm of connective and other soft tissue, unspecified: Secondary | ICD-10-CM

## 2012-09-12 DIAGNOSIS — Z01419 Encounter for gynecological examination (general) (routine) without abnormal findings: Secondary | ICD-10-CM

## 2012-09-12 DIAGNOSIS — Z0000 Encounter for general adult medical examination without abnormal findings: Secondary | ICD-10-CM

## 2012-09-12 DIAGNOSIS — D62 Acute posthemorrhagic anemia: Secondary | ICD-10-CM

## 2012-09-12 LAB — POCT URINALYSIS DIPSTICK
Blood, UA: 2
Glucose, UA: 500
Ketones, UA: NEGATIVE

## 2012-09-13 DIAGNOSIS — D219 Benign neoplasm of connective and other soft tissue, unspecified: Secondary | ICD-10-CM | POA: Insufficient documentation

## 2012-09-13 DIAGNOSIS — D62 Acute posthemorrhagic anemia: Secondary | ICD-10-CM | POA: Insufficient documentation

## 2012-09-13 NOTE — Patient Instructions (Signed)
My office will call with information about the GI referral.

## 2012-09-13 NOTE — Progress Notes (Signed)
57 y.o. [id number] SingleAfrican AmericanF with complex PMHx here in referral from Dr. Ezzard Flax at [facility] urology.  History was difficulty to obtain but was pieced together from review of EPIC chart, outside records (which were obtained while the patient was in the office), and through history from patient.  Stated reason from patient for visit is "to figure out how to get the fibroids off my bladder".    Patient presented to Dr. Berneice Heinrich due to microscopic hematuria with several negative UA and urine cultures.  CT urogram 3/14 showed thickened, enhancing bladder with some concern for bladder and/or pelvic cancer.  Also showed large fibroid uterus.  Most significantly, it showed cirrhosis of the liver with probable oprtal hypertension, varicies, and splenomegaly.  She underwent a cystoscope with directed biopsies on 09/07/12.  Pathology was negative but procedure was complicated by significant hematuria including passage of clots.  She bled so much that she went into urinary retention due to not being able to pass clots.  Her hemoglobin dropped to 7.4 before stabilizing.  She was not transfused.    Review of EPIC records show pelvic ultrasound back in 2000 with enlarged uterus and multiple fibroids.  Largest ten was around 5cm.  CT 12/06 showed enlarged uterus 12.2cm with multiple calcified fibroids.  Largest fibroid then was 7.1cm.  There was fatty infiltration of the liver then but not cirrhosis.  Patient also reports pelvic pain and dyspareunia over the last six to seven months.  It is accompanied by cramping which she described can be 10 out of 10 pain.  The pain has gradually been getting worse and is worse with any kind of movement. Is localized to the lower abdomen.  Associated is a history of urinary symptoms with dysuria, frequency, urgency, and occasional blood in her urine. She's been on multiple antibiotics without any help.  She denies any discharge, itching, or odor. The patient is sexually active and  her significant other lives in [place].  Pain is worsened with intercourse.  GC/Chl in both ER (08/14/12) and at [facility] negative.  She also reports significant urgency and frequency.  She has no PCP and has had no recent gyn exams.  She reports no vaginal bleeding in 12 years.  Past history is significant for drug use including cocaine, alcohol abuse, and smoking.  She was incarcerated in the past and has a diagnosis of Hep C.   Health Maintenance: Pap:  Pt unsure.  Last one in chart 1/09--neg MMG:  Pt unsure but neg 1/06 Colonoscopy:  Never had one  ROS:  Positive for overall body weakness, easy bleeding, depressed mood, urgency, hematuria, and nocturia.  Otherwise, a comprehensive ROS was negative.   reports that she quit smoking about 12 years ago. Her smoking use included Cigarettes. She has a 5 pack-year smoking history. She has never used smokeless tobacco. 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 She reports that she does not drink alcohol.  Past Medical History  Diagnosis Date  . Diabetes mellitus   . Hypertension   . Heart palpitations   . Anxiety   . Depression   . Bronchitis   . GERD (gastroesophageal reflux disease)     hx of  . Arthritis     shoulder  . Hepatitis C   . Diabetic neuropathy   . Substance abuse     Cocaine in past.  Clean since 2002.  Marland Kitchen Atherosclerosis   . Cirrhosis of liver due to hepatitis  C     Past Surgical History  Procedure Laterality Date  . Tonsillectomy    . Tubal ligation    . Cesarean section  1987  . Transurethral resection of bladder tumor N/A 09/07/2012    Procedure: TRANSURETHRAL RESECTION OF BLADDER TUMOR (TURBT);  Surgeon: Sebastian Ache, MD;  Location: [facility] ORS;  Service: Urology;  Laterality: N/A;  Also Exam Under Anesthesia and Cervical Biopsy   . Cystoscopy w/ retrogrades Bilateral 09/07/2012    Procedure: CYSTOSCOPY WITH RETROGRADE PYELOGRAM;  Surgeon: Sebastian Ache, MD;   Location: [facility] ORS;  Service: Urology;  Laterality: Bilateral;    Current Outpatient Prescriptions  Medication Sig Dispense Refill  . insulin glargine (LANTUS) 100 UNIT/ML injection Inject 30 Units into the skin at bedtime.      . insulin regular (NOVOLIN R,HUMULIN R) 100 units/mL injection Inject 5-20 Units into the skin daily as needed (per sliding scale). 200-220=5units if over 250 she does 20 units.Not a set rate she does this on her own.      . metFORMIN (GLUCOPHAGE) 1000 MG tablet Take 1,000 mg by mouth 2 (two) times daily with a meal.      . traMADol (ULTRAM) 50 MG tablet Take 2 tablets (100 mg total) by mouth every 8 (eight) hours as needed for pain.  30 tablet  0  . lisinopril-hydrochlorothiazide (PRINZIDE,ZESTORETIC) 10-12.5 MG per tablet Take 1 tablet by mouth daily.      . Multiple Vitamin (MULTIVITAMIN WITH MINERALS) TABS Take 1 tablet by mouth daily.      . phenazopyridine (PYRIDIUM) 100 MG tablet Take 1 tablet (100 mg total) by mouth 3 (three) times daily as needed for pain. (urinary burning post-op)  30 tablet  1   No current facility-administered medications for this visit.    Family History  Problem Relation Age of Onset  . Diabetes Mother   . Congestive Heart Failure Mother   . Hypertension Mother   . Diabetes Brother   . Hypertension Brother   . Seizures Brother     had brain surgery-no issues since  . Hypertension Brother   . Graves' disease Brother     Exam:   BP 142/90  Pulse 86  Resp 18  Ht 5\' 4"  (1.626 m)  Wt 143 lb (64.864 kg)  BMI 24.53 kg/m2  Height:   Height: 5\' 4"  (162.6 cm)  Ht Readings from Last 3 Encounters:  09/12/12 5\' 4"  (1.626 m)  09/09/12 5\' 4"  (1.626 m)  08/31/12 5\' 4"  (1.626 m)    General appearance: alert, cooperative and appears stated age Head: Normocephalic, without obvious abnormality, atraumatic Lungs: clear to auscultation bilaterally Heart: regular rate and rhythm Abdomen: soft, non-tender; bowel sounds normal; no masses,  no  organomegaly Extremities: extremities normal, atraumatic, no cyanosis or edema Skin: Skin color, texture, turgor normal. No rashes or lesions Lymph nodes: No inguinal adenopathy  Neurologic: Grossly normal   Pelvic: External genitalia:  no lesions              Urethra:  normal appearing urethra with no masses, tenderness or lesions              Bartholins and Skenes: normal                 Vagina: normal appearing vagina with normal color and discharge, no lesions              Cervix: parous, recent biopsy site healing well  Pap taken: yes Bimanual Exam:  Uterus:  enlarged and globular uterus, tender on exam, no CMT              Adnexa: cannot palpate due to size of uterus               Anus:  normal sphincter tone, no lesions  A:  Long standing hx of uterine fibroids Poorly controlled DM Hep C Anemia due to recent blood loss from bladder biopsies Cirrhosis with portal hypertension, varicies, and splenomegaly  P: Very frank discussion occurred with patient.  She is not a surgical candidate at this time due to her significant cirrhosis.  Also, fibroids are not new and have been seen on imaging as far back as 2000.  She needs to be seen by gastroenterology specializing in cirrhosis.  Discussed with pt probably resulting from Hep C history.  Discussed with patient bleeding from biopsies most likely due to clotting abnormality resulting from cirrhosis and poor liver function.  There is really NOTHING I can do for her at this time except help her get to the right gastroenterologist.  This information all seems new to her and she is frustrated that she is seeing me but I cannot offer her any treatments.  She really wants to consider options for fibroid removal.  Again, reinforced that she is not a good surgical candidate at this time and I will not be offering her surgery.  I will help make the referral for her.  Pap smear was done today.  MMG recommended.  After lengthy discussion  during which patient was allowed to ask all questions she had, patient seemed satisfied with answered.  ~In excess of 60 minutes spent with patient >50% of time was in face to face discussion of above.

## 2012-09-14 ENCOUNTER — Telehealth: Payer: Self-pay | Admitting: Obstetrics & Gynecology

## 2012-09-14 ENCOUNTER — Other Ambulatory Visit: Payer: Self-pay | Admitting: Orthopedic Surgery

## 2012-09-14 NOTE — Telephone Encounter (Signed)
Patient notified of Dr Rondel Baton instruction regarding referral and left note out of work at desk for patient to pick up on 09-17-12 830 am (pt request).  Patient initially stated that she would pick up note tomorrow and when I advised her we would  Be closed, she was confused and really had no idea that it was 1815 or Fri afternoon.  Patient is notified that she does not need to keep appointment with dr Loreta Ave and we will call her as soon as we received Dr Kenna Gilbert recommendation for who to refer her to. (note out of work written on RX pad and left at front desk, copy made)

## 2012-09-14 NOTE — Telephone Encounter (Signed)
She can have a note for wed, thurs, and today.  She does not need to see dr Loreta Ave.  She needs a specialist who deals with cirrhosis.  i already called dr Loreta Ave and left her a message for her recommendation.  You can stop that referral please.  i will be back in touch with the patient next week.

## 2012-09-14 NOTE — Telephone Encounter (Signed)
pt calling to check status of referral to gastroenterologist/also checking on status of work note excusing her until 11/17/12/[place]

## 2012-09-17 ENCOUNTER — Telehealth: Payer: Self-pay | Admitting: Obstetrics & Gynecology

## 2012-09-17 LAB — IPS PAP TEST WITH HPV

## 2012-09-17 NOTE — Telephone Encounter (Signed)
PATIENT NOTIFIED OF RESPONSE FROM DR. MILLER. PATIENT IS AWARE OF WORK EXCUSE FOR LAST WEEK AT FRONT DESK.  SUE

## 2012-09-17 NOTE — Telephone Encounter (Signed)
Please let pt know she can return to work now.  Her fibroids are not new and I cannot write her out of work any longer.  GI referral info faxed to [facility].  They review and then make the appt and call us back.  I spoke with their scheduler this morning and will follow up tomorrow if we don't have an appt by then.

## 2012-09-17 NOTE — Telephone Encounter (Signed)
Spoke with pt who complains of belly pain "all over, ongoing". Pt feels bloated as well. States her bleeding has stopped. Pt eating OK. Blood glucose was 94 this am. Denies fever. Taking Aleve as needed for pain. Pt reported vomiting and diarrhea 2 nights ago which improved the next day. Pt would like to go back to work as soon as she can, as she "has bills to pay". Pt asked about referral to GI doctor. Advised we are getting a recommendation from Dr. Loreta Ave for a GI doctor who specializes in her particular problem, and we would call back with an appt as soon as we can. Pt wondering when it is OK for her to go back to work, and not sure if she needs a note to do so. Any advice?   aa

## 2012-09-17 NOTE — Telephone Encounter (Signed)
Pt is calling about a note to go back to work. She wants to go back to work even though her stomach is hurting all the time. She is still having pain and is unsure what is going on. She doesn't know if its the healing process from her bx or if something else is wrong. She is confused and not sure if she needs to see Dr. Hyacinth Meeker again or [facility]. Please call patient.

## 2012-09-27 ENCOUNTER — Telehealth: Payer: Self-pay | Admitting: Orthopedic Surgery

## 2012-09-27 NOTE — Telephone Encounter (Signed)
I also did a referral to [facility] which she may hear about since the [facility] appt is so far off.  Both of these GI departments had me send records, they review them, and then call the pt with the appt.

## 2012-09-27 NOTE — Telephone Encounter (Signed)
LMTCB about upcoming appt.    (Need to tell her about appt at [facility] with Dr Rochel Brome 10-31-12 at 1300. Address [facility] [place], [place] [address]. Phone [phone])

## 2012-10-02 NOTE — Telephone Encounter (Signed)
She could do a uterine artery embolization but she will not be a candidate for this until her her cirrhosis is under better control.  I can send her for a consultation with Dr. Fredia Sorrow if she desires but I know he won't do anything until the cirrhosis is better due to risks of bleeding.  If she wants the referral I will go ahead and make it for her.  Just let me know.

## 2012-10-02 NOTE — Telephone Encounter (Signed)
Patient called today concern of spotting and the enlargement of the her Fibroids causing cramping and discomfort. She is wanting to know if there is some type of non invasive procedure that can remove the fibroids. States they are interferring with her bladder.  Last OV in EPIC. 09/12/2012.sue

## 2012-10-02 NOTE — Telephone Encounter (Signed)
Patient wants to know what she can do about the spotting?/[place]

## 2012-10-03 NOTE — Telephone Encounter (Signed)
i am sorry but i do not understand the question.  Please clarify.

## 2012-10-03 NOTE — Telephone Encounter (Signed)
Left message to return call. Called cell#/ sue

## 2012-10-03 NOTE — Telephone Encounter (Signed)
Patient given response per Dr. Hyacinth Meeker regarding fibroids. Patient understands and thanks you for this info. At this phone call patient is requesting what advise or conversation per urologist Dr. Berneice Heinrich coment on.? sue

## 2012-10-04 NOTE — Telephone Encounter (Signed)
FROM PHONE CALL YESTERDAY PATIENT IS REFERRING TO HER UROLOGIST DR. MANNY  IF HE HAD ANY COMMENTS TO YOU REGARDING HER SURGERY HE DID.? 09/07/2012

## 2012-10-04 NOTE — Telephone Encounter (Signed)
I sent him all the notes from that day and asked for a call if he had any questions for me.  He has not called.

## 2012-10-05 NOTE — Telephone Encounter (Signed)
Patient notified of response from Dr. Hyacinth Meeker. Explained if Dr. Hyacinth Meeker receives any records or advise for patient we will let her know per Dr. Hyacinth Meeker. sue

## 2012-10-24 ENCOUNTER — Other Ambulatory Visit: Payer: Self-pay | Admitting: Physician Assistant

## 2012-10-24 DIAGNOSIS — B1920 Unspecified viral hepatitis C without hepatic coma: Secondary | ICD-10-CM

## 2012-10-31 ENCOUNTER — Encounter: Payer: Self-pay | Admitting: Gastroenterology

## 2012-11-23 ENCOUNTER — Encounter: Payer: Self-pay | Admitting: Gastroenterology

## 2012-11-23 ENCOUNTER — Ambulatory Visit (INDEPENDENT_AMBULATORY_CARE_PROVIDER_SITE_OTHER): Payer: BC Managed Care – PPO | Admitting: Gastroenterology

## 2012-11-23 VITALS — BP 126/70 | HR 88 | Ht 63.0 in | Wt 139.0 lb

## 2012-11-23 DIAGNOSIS — K7460 Unspecified cirrhosis of liver: Secondary | ICD-10-CM

## 2012-11-23 NOTE — Patient Instructions (Addendum)
You were referred to two gastroenterologists (Dr. Majel Homer, your liver doctor sent you here at [facility] to consider EGD for varices screening.  Your gynecologist sent you to [facility] for liver care and you are already scheduled for EGD and colonoscopy next month).  Dr. Christella Hartigan is happy to be your gastroenterologist if you want, otherwise you should continue ahead with plans for EGD and colonoscopy at [facility].

## 2012-11-23 NOTE — Progress Notes (Signed)
HPI: This is a   very pleasant 57 year old woman whom I am meeting for the first time today.  Recent CT scan at [facility] urology (which I cannot access via EPIC) suggests cirrhosis, portal HTN.  [place] ordered by gastroenterologist Dr. Dorena Cookey in 2009: No evidence of hepatoma. Although there is no definite sonographic evidence of cirrhosis, prominence of the spleen and vessels in the splenic hilum are suspicious for mild portal venous hypertension. Cholelithiasis without cholecystitis.  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 INR normal, protime a bit elevated.  She was seen at [facility] in past few weeks and is already scheduled for EGD and colonoscopy next month.  She is concerned about having both procedures on the same day  she did not address that concern with them  She was seen at [facility] Hepatology Dr. Majel Homer last months who is going to be following her hepatitis C.  Dr. Majel Homer referred her to me to consider EGD screening for varices however she was already seen at [facility] since then and they are planning, already have scheduled   Review of systems: Pertinent positive and negative review of systems were noted in the above HPI section. Complete review of systems was performed and was otherwise normal.    Past Medical History  Diagnosis Date  . Diabetes mellitus   . Hypertension   . Heart palpitations   . Anxiety   . Depression   . Bronchitis   . GERD (gastroesophageal reflux disease)     hx of  . Arthritis     shoulder  . Hepatitis C   . Diabetic neuropathy   . Substance abuse     Cocaine in past.  Clean since 2002.  Marland Kitchen Atherosclerosis   . Cirrhosis of liver due to hepatitis C   . Anemia   . Broken ankle     left    Past Surgical History  Procedure Laterality Date  . Tonsillectomy    . Tubal ligation    . Cesarean section  1987  . Transurethral resection of bladder tumor N/A 09/07/2012     Procedure: TRANSURETHRAL RESECTION OF BLADDER TUMOR (TURBT);  Surgeon: Sebastian Ache, MD;  Location: [facility] OR[facility];  Service: Urology;  Laterality: N/A;  Also Exam Under Anesthesia and Cervical Biopsy   . Cystoscopy w/ retrogrades Bilateral 09/07/2012    Procedure: CYSTOSCOPY WITH RETROGRADE PYELOGRAM;  Surgeon: Sebastian Ache, MD;  Location: [facility];  Service: Urology;  Laterality: Bilateral;    Current Outpatient Prescriptions  Medication Sig Dispense Refill  . insulin glargine (LANTUS) 100 UNIT/ML injection Inject 30 Units into the skin at bedtime.      . insulin regular (NOVOLIN R,HUMULIN R) 100 units/mL injection Inject 5-20 Units into the skin daily as needed (per sliding scale). 200-220=5units if over 250 she does 20 units.Not a set rate she does this on her own.      . lisinopril (PRINIVIL,ZESTRIL) 10 MG tablet Take 10 mg by mouth daily.      . metFORMIN (GLUCOPHAGE) 1000 MG tablet Take 1,000 mg by mouth 2 (two) times daily with a meal.      . Multiple Vitamin (MULTIVITAMIN WITH MINERALS) TABS Take 1 tablet by mouth daily.      . traMADol (ULTRAM) 50 MG tablet Take 2 tablets (100 mg total) by mouth every 8 (eight) hours as needed for pain.  30 tablet  0   No current facility-administered medications  for this visit.    Allergies as of 11/23/2012  . (No Known Allergies)    Family History  Problem Relation Age of Onset  . Diabetes Mother   . Congestive Heart Failure Mother   . Hypertension Mother   . Diabetes Brother   . Hypertension Brother     x 2  . Seizures Brother     had brain surgery-no issues since  . Graves' disease Brother   . Liver disease Brother     x 2, Hep C    History   Social History  . Marital Status: Single    Spouse Name: N/A    Number of Children: 3  . Years of Education: N/A   Occupational History  . director    Social History Main Topics  . Smoking status: Former Smoker -- 0.25 packs/day for 20 years    Types: Cigarettes    Quit date: 06/13/2000   . Smokeless tobacco: Never Used  . Alcohol Use: No     Comment: used to drink but none since 2002  . Drug Use: Yes    Special: Cocaine, Heroin, Marijuana, Hashish, LSD     Comment: last time in 70's for most, cocaine in 2002  . Sexually Active: Yes -- Female partner(s)    Birth Control/ Protection: Condom   Other Topics Concern  . Not on file   Social History Narrative  . No narrative on file       Physical Exam: BP 126/70  Pulse 88  Ht 5\' 3"  (1.6 m)  Wt 139 lb (63.05 kg)  BMI 24.63 kg/m2 Constitutional: generally well-appearing Psychiatric: alert and oriented x3 Eyes: extraocular movements intact Mouth: oral pharynx moist, no lesions Neck: supple no lymphadenopathy Cardiovascular: heart regular rate and rhythm Lungs: clear to auscultation bilaterally Abdomen: soft, nontender, nondistended, no obvious ascites, no peritoneal signs, normal bowel sounds Extremities: no lower extremity edema bilaterally Skin: no lesions on visible extremities    Assessment and plan: 57 y.o. female with  cirrhosis, hepatitis C positive  She was referred here to my office by Dr. Majel Homer at [facility] care, [facility] [place] office.  That was to consider EGD for varices screening. I reviewed Dr. Ladona Mow office and it appears pretty clear that she is taking over care of chronic hepatitis C, she'll workup including genotyping. They are going to consider starting hepatitis C treatment pending the results of her tests.  She was simultaneously referred ( I believe by her gynecologist) to [facility] gastroenterology Department for hepatitis C, cirrhosis. She noted in the past few weeks and she is already scheduled for upper endoscopy and colonoscopy. I do not have any of their reports available for review.  I explained to her that there has been some overlapping referrals. I am happy to take over her GI care and she is interested. She has decided to stick with [facility] and  will plan to go ahead with her EGD and colonoscopy there.

## 2012-12-28 ENCOUNTER — Telehealth: Payer: Self-pay | Admitting: Obstetrics & Gynecology

## 2012-12-28 NOTE — Telephone Encounter (Signed)
Pt says she is in a lot of pain due to the fibroids. Says she has gone to the referring doctors dr Hyacinth Meeker referred her to and would like to have an appintment soon as possible.

## 2012-12-31 NOTE — Telephone Encounter (Signed)
I need all the notes from Dr, Leslie Dales please.  Have her call and get records faxed to me please.

## 2012-12-31 NOTE — Telephone Encounter (Signed)
Last OV 09/2012 Patient called concern of pain of fibroids and pink discharge when wiping. Wants to discuss her options of what to do about the fibroids and to give her relief. Patient states she is now under the care of [facility]. [facility] and is seeing Dr. Alisia Ferrari. She is starting today the medicaiton Olysio (simeprevir) 150ng one a day. And Sovaldi (sofosbuvir) 400mg  one a day. She will be on this Rx for Hepatitis C for 4 month , (12 weeks) and her lab work will be monitored . Patient wants appointment. Next available appt. For Dr. Hyacinth Meeker is Aug. 4th office visit @ 8:00am Please advise.

## 2012-12-31 NOTE — Telephone Encounter (Signed)
Patient notified of need to have all her records faxed to our office from Dr. Leslie Dales office . Patient will call and get this done in the morning.

## 2013-01-10 NOTE — Telephone Encounter (Signed)
Pt thought she was going to have a follow up appointment with Dr. Hyacinth Meeker. There is no appointment in the system for her.

## 2013-01-11 NOTE — Telephone Encounter (Signed)
Patient notified of change in appt. Date with Dr. Hyacinth Meeker is to be August 01/2013 @ 11:30.  Patient reminded again of need to call dr. Leslie Dales office to have her records faxed to [facility] for Dr. Hyacinth Meeker to review.

## 2013-01-17 ENCOUNTER — Telehealth: Payer: Self-pay | Admitting: *Deleted

## 2013-01-17 NOTE — Telephone Encounter (Signed)
Records received and reviewed by Dr Hyacinth Meeker.  Per Dr Hyacinth Meeker, patient is not a candidate for surgery and recommend options of referral to [facility] and/or referral to Dr Fredia Sorrow for uterine artery embolization.  Patient states she is willing to consider these options after discussing with Dr Hyacinth Meeker but wants Dr Hyacinth Meeker to recheck "biopsy" site from previous physician.  She wants to see how it compares now to previous exam "for my peace of mind".  Requests to keep scheduled appointment with Dr Hyacinth Meeker tomorrow.

## 2013-01-17 NOTE — Telephone Encounter (Signed)
Records still not received.  Call to patient who states she has signed release at their office and she will call them and call us back.

## 2013-01-18 ENCOUNTER — Encounter: Payer: Self-pay | Admitting: Obstetrics & Gynecology

## 2013-01-18 ENCOUNTER — Ambulatory Visit (INDEPENDENT_AMBULATORY_CARE_PROVIDER_SITE_OTHER): Payer: BC Managed Care – PPO | Admitting: Obstetrics & Gynecology

## 2013-01-18 VITALS — BP 112/68 | HR 64 | Resp 16 | Ht 64.0 in | Wt 139.0 lb

## 2013-01-18 DIAGNOSIS — D259 Leiomyoma of uterus, unspecified: Secondary | ICD-10-CM

## 2013-01-18 DIAGNOSIS — K7460 Unspecified cirrhosis of liver: Secondary | ICD-10-CM | POA: Insufficient documentation

## 2013-01-18 DIAGNOSIS — D219 Benign neoplasm of connective and other soft tissue, unspecified: Secondary | ICD-10-CM

## 2013-01-18 NOTE — Progress Notes (Signed)
57 y.o. Single African American female [phone] here for follow up of fibroids/cirrhosis/Hep C and to discuss options.  She has seen three separate liver specialistis--[facility], [facility], and here locally.  She had viral load of greater than 2 million.  She has started treatment with sofosbuvir and simeprevir and, overall, is feeling much better.  She will have follow up labs and will do at least 12 weeks of therapy.  Significant viral suppression is expected.    From gyn perspective, she is still feeling a lot of pressure and pain from her enlarged fibroid uterus.  She wants to discuss surgery or other options.  We reviewed, again, her CT showing cirrhosis of the liver with resulting varicosities.  I do not feel she is a surgical candidate and I was very frank with her that I will not be doing any surgery on her.  She does want a second opinion about this so I will send records for review to fibroid clinic at [facility].  She is aware they may decline seeing her if they agree.  Also, I do not think she is a candidate for uterine artery embolization but will check with Dr. Fredia Sorrow about this.  She knows she may also not be a candidate for this procedure but that I will find out.  Also, she knows he may just want to see her and review results together with her and then decide.    Finally, she wants me to check her cervix again to make sure it has healed.  She has a biopsy of normal tissue done by urology.    O: WD,WN female Pelvic exam:EXTERNAL GENITALIA: normal appearing vulva with no masses, tenderness or lesions VAGINA: no abnormal discharge or lesions CERVIX: no lesions or cervical motion tenderness and cervix well healed  A:Large fibroid uterus, h/o cirrhosis due to hep C now on treatment  P: Send records to fibroid clinic at [facility] for second opinion.   Consider consultation with Dr. Fredia Sorrow regarding [place], if pt is a candidate.

## 2013-01-18 NOTE — Patient Instructions (Addendum)
We will call you with information about your possible referrals--Dr. Fredia Sorrow, Fibroid clinic at [facility]

## 2013-01-22 ENCOUNTER — Encounter: Payer: Self-pay | Admitting: Obstetrics & Gynecology

## 2013-02-04 NOTE — Addendum Note (Signed)
Addended by: Jerene Bears on: 02/04/2013 11:46 AM   Modules accepted: Orders

## 2013-02-12 ENCOUNTER — Other Ambulatory Visit: Payer: Self-pay | Admitting: Obstetrics & Gynecology

## 2013-02-12 DIAGNOSIS — D259 Leiomyoma of uterus, unspecified: Secondary | ICD-10-CM

## 2013-02-14 ENCOUNTER — Telehealth: Payer: Self-pay | Admitting: Orthopedic Surgery

## 2013-02-14 NOTE — Telephone Encounter (Signed)
Spoke with pt about appt at [facility] with Dr. Fredia Sorrow for consult for uterine artery embolization. Appt 02-21-13 arriving at 10:15 for a 10:30 appt. Phone [phone]. Address [address] location. Pt agreeable.

## 2013-02-21 ENCOUNTER — Other Ambulatory Visit (HOSPITAL_COMMUNITY): Payer: Self-pay | Admitting: Interventional Radiology

## 2013-02-21 ENCOUNTER — Other Ambulatory Visit: Payer: Self-pay | Admitting: Emergency Medicine

## 2013-02-21 ENCOUNTER — Ambulatory Visit
Admission: RE | Admit: 2013-02-21 | Discharge: 2013-02-21 | Disposition: A | Discharge status: Home / Self Care | Payer: BC Managed Care – PPO | Source: Ambulatory Visit | Attending: Obstetrics & Gynecology | Admitting: Obstetrics & Gynecology

## 2013-02-21 DIAGNOSIS — D259 Leiomyoma of uterus, unspecified: Secondary | ICD-10-CM

## 2013-02-21 DIAGNOSIS — E119 Type 2 diabetes mellitus without complications: Secondary | ICD-10-CM

## 2013-02-26 NOTE — Progress Notes (Signed)
Patient ID: [name], female   DOB: 23-Jul-1955, 57 y.o.   MRN: [id number]  NEW PATIENT OFFICE VISIT  February 21, 2013  M. Leda Quail, MD [facility] [address] [place], [place] [address]  Re: [name] (DOB: 02-12-2056)  Dear Rosalita Chessman:  Thank you for referring Ms. Marinell Blight for opinion regarding feasibility of uterine fibroid embolization. The patient is a 63- year-old G5, P52 African-American female with a longstanding history of uterine fibroids dating back to at least 2000 at which time, fibroids were detected by imaging. The patient complains of chronic and worsening symptoms of pelvic pain and dyspareunia. She also complains of dysuria, urinary urgency and increased urinary frequency. The patient did undergo a work up by Dr. Dwana Curd for microscopic hematuria in March which included CT imaging demonstrating a thickened bladder and an enlarged fibroid uterus. She underwent cystoscopy with biopsy of the bladder and cervix. Bladder biopsy demonstrated benign mucosa with inflammation and giant cells. Cervical biopsy was negative. Biopsies were complicated by hematuria and urinary retention which resolved. The patient has not had any fibroid therapy or surgery. She is post menopausal and has not had any menstrual cycles for 12 years. The patient has no history of gynecologic infections.  Past Medical History: 1. History of chronic hepatitis C infection with associated cirrhosis and portal hypertension. The patient has associated splenomegaly and chronic thrombocytopenia. Chronic platelet count is approximately 60-70,000. She is on chronic antiviral treatment and is being followed by [facility] locally. 2. History of insulin dependent diabetes mellitus. Associated diabetic neuropathy. 3. History of hypertension. Patient's primary care physician is Dr. Renaye Rakers. 4. History of prior drug abuse and illicit drug use.  The patient has not abused drugs since 2002. 5. History of gastroesophageal reflux. 6. History of anxiety and depression.  Medications: Lantus insulin 100 units daily, regular insulin 100 units daily, lisinopril 10 mg daily, metformin 1000 mg daily, simeprevir 150 mg daily, sofosbuvir 400 mg daily, tramadol 50 mg as needed.  Allergies: No known drug allergies.  Page 2 Re: [name] (DOB: 02-12-2056)  Social History: The patient is single and has three children with a 52 year old and 37 year old twins. She lives in [place]. She currently is a Consulting civil engineer and is in the process of obtaining a doctorate in [employer]. She denies alcohol or tobacco use.  Family History: Mother with diabetes, hypertension and congestive heart failure. Brother with history of diabetes, hypertension, seizure, liver disease and Graves' disease.  Review of Systems: General: Chronic fatigue. Gastrointestinal: No vomiting of blood or melena. Occasional abdominal pain. No nausea or vomiting. Genitourinary: Some chronic urgency with urination and increased urinary frequency. Cardiovascular: No chest pain. Musculoskeletal: Chronic back pain and joint pain. Neurologic: Peripheral neuropathy. Respiratory: No shortness of breath or cough.  Exam: Vital signs: Blood pressure 140/71, pulse 93, respirations 14, temperature 98.1. Oxygen saturation 100% on room air. General: No acute distress. Chest: Clear to auscultation bilaterally. Heart: Regular rate and rhythm. No audible murmurs. Abdomen: Soft and nondistended. The uterus is difficult to palpate by manual exam. No significant tenderness is elicited. Extremities: Normal 2+ bilateral femoral pulses and normal pedal pulses. No lower extremity edema.  Labs: WBC 4.4, hemoglobin 8.5, hematocrit 25.8, platelet 61,000 on 09/10/12. BUN 9, creatinine 0.63 and estimated GFR greater than 90 ml/minute.  Imaging: I personally reviewed prior CT studies of the  pelvis dated 05/24/05 and 08/27/12. These were performed with IV contrast. Both show a roughly 7  cm, densely calcified fundal fibroid and other partially calcified fibroids. The study in 2006 shows estimated uterine dimensions of 14.0 x 7.2 x 10.3 cm with estimated uterine volume of 540 ml. At least 8-10 enhancing uterine fibroids are identifiable on that study. The 08/27/12 study shows estimated uterine dimensions of 12.9 x 7.4 x 7.8 cm with estimated uterine volume of 387 ml. Uterine fibroids show less visible enhancement by CT on that study.  Assessment: I met with Ms. Matsushita and reviewed imaging studies as well as treatment options with her. She does appear to have some persistent bulk symptoms related to uterine enlargement but is post menopausal and underwent menopause at the age of 27. When reviewing her prior CT scans, there clearly also has been a decrease in uterine volume over the last eight years. There remains a dominant 7 cm densely calcified and degenerated fibroid present which likely is exerting some degree of chronic mass effect on the bladder resulting in the clinical bladder symptoms and bladder inflammation diagnosed in March. She is currently not having any abnormal vaginal bleeding.  It is unusual to perform fibroid embolization in someone who has been post menopausal for some time. Given her bulk symptoms, I did suggest that one option for further evaluation would be a pelvic MRI to determine exact morphology of fibroids and degree of enhancement after administration of gadolinium. This may be a predictor of whether the patient would benefit from embolization to try to decrease uterine volume further. If the fibroids show very little enhancement with gadolinium, there likely would be less effectiveness in performing embolization. The patient also clearly has some comorbid risk factors which make any procedure of higher risk. The main risk with regard to  embolization would be the chronic thrombocytopenia which increases bleeding risk at the femoral access  page 3 Re: [name] (DOB: 08/19/2055)  site. Platelets could be given concomitantly at the time of the procedure and also bleeding risk reduced with use of a closure device.  After discussing options and risks, the patient is agreeable to pursuing pelvic MRI with gadolinium electively. After it is performed and interpreted, I will meet back with the patient to review results. A copy of the interpretation will also be sent to you.  Thank you again for allowing me to participate in Ms. Goshorn's care. I will keep you updated with MRI results and whether the patient would potentially benefit from fibroid embolization.  Sincerely,  Irish Lack, MD /et  C: Renaye Rakers, MD Sebastian Ache, MD Wynona Neat, PA-C - [facility]

## 2013-02-27 ENCOUNTER — Ambulatory Visit (HOSPITAL_COMMUNITY)
Admission: RE | Admit: 2013-02-27 | Discharge: 2013-02-27 | Disposition: A | Discharge status: Home / Self Care | Payer: BC Managed Care – PPO | Source: Ambulatory Visit | Attending: Interventional Radiology | Admitting: Interventional Radiology

## 2013-02-27 DIAGNOSIS — D259 Leiomyoma of uterus, unspecified: Secondary | ICD-10-CM

## 2013-02-27 DIAGNOSIS — I10 Essential (primary) hypertension: Secondary | ICD-10-CM | POA: Insufficient documentation

## 2013-02-27 DIAGNOSIS — R3915 Urgency of urination: Secondary | ICD-10-CM | POA: Insufficient documentation

## 2013-02-27 DIAGNOSIS — N949 Unspecified condition associated with female genital organs and menstrual cycle: Secondary | ICD-10-CM | POA: Insufficient documentation

## 2013-02-27 DIAGNOSIS — D251 Intramural leiomyoma of uterus: Secondary | ICD-10-CM | POA: Insufficient documentation

## 2013-02-27 DIAGNOSIS — E119 Type 2 diabetes mellitus without complications: Secondary | ICD-10-CM | POA: Insufficient documentation

## 2013-02-27 MED ORDER — GADOBENATE DIMEGLUMINE 529 MG/ML IV SOLN
13.0000 mL | Freq: Once | INTRAVENOUS | Status: AC | PRN
  Administered 2013-02-27: 13 mL via INTRAVENOUS

## 2013-02-28 ENCOUNTER — Ambulatory Visit
Admission: RE | Admit: 2013-02-28 | Discharge: 2013-02-28 | Disposition: A | Discharge status: Home / Self Care | Payer: BC Managed Care – PPO | Source: Ambulatory Visit | Attending: Interventional Radiology | Admitting: Interventional Radiology

## 2013-02-28 DIAGNOSIS — D259 Leiomyoma of uterus, unspecified: Secondary | ICD-10-CM

## 2013-02-28 LAB — POCT I-STAT CREATININE: Creatinine, Ser: 0.9 mg/dL (ref 0.50–1.10)

## 2013-03-01 NOTE — Progress Notes (Signed)
Patient ID: [name], female   DOB: 02-May-1956, 57 y.o.   MRN: [id number]  ESTABLISHED PATIENT OFFICE VISIT  HISTORY OF PRESENT ILLNESS: Ms. [name] returns to review pelvic MRI results. An MRI of the pelvis was performed with and without gadolinium on 02/27/2013. Since initial evaluation 1 week ago, the patient states that her pelvic pain has actually decreased and is not bothering her significantly. She does have complaint of bilateral shoulder pain and other joint pain.  Imaging: MRI of the pelvis was reviewed directly with the patient. This demonstrates moderate uterine enlargement and a dominant, nonenhancing 7.5 cm central fibroid correlating with the previously seen densely calcified fibroid on prior CT studies. Other smaller fibroids demonstrate some degree of enhancement after gadolinium. The next largest fibroid is a 4.5 cm superior fundal fibroid. There are several other smaller fibroids in the 2 cm range at the level of the uterine body and lower uterine segment.  CHIEF COMPLAINT: Symptomatic uterine fibroids.  PHYSICAL EXAMINATION: No exam performed today.  ASSESSMENT AND PLAN: Given improvement in pelvic symptoms as well as higher risk of bleeding complication from any procedures due to chronic thrombocytopenia, I have recommended that we currently not performed uterine fibroid embolization. The MRI shows no significant enhancement of the dominant calcified central fibroid. There are some other fibroids present which do demonstrate enhancement but are smaller in size. I recommended pursuing embolization only if the patient's bulk symptoms clearly worsen over time. At that time, MRI may also be helpful to determine if there is any significant enlargement of enhancing fibroids.

## 2013-06-14 DIAGNOSIS — Z0289 Encounter for other administrative examinations: Secondary | ICD-10-CM

## 2013-07-15 ENCOUNTER — Other Ambulatory Visit: Payer: Self-pay | Admitting: Internal Medicine

## 2013-07-15 DIAGNOSIS — C220 Liver cell carcinoma: Secondary | ICD-10-CM

## 2013-07-22 ENCOUNTER — Ambulatory Visit
Admission: RE | Admit: 2013-07-22 | Discharge: 2013-07-22 | Disposition: A | Discharge status: Home / Self Care | Payer: BC Managed Care – PPO | Source: Ambulatory Visit | Attending: Internal Medicine | Admitting: Internal Medicine

## 2013-07-22 DIAGNOSIS — C220 Liver cell carcinoma: Secondary | ICD-10-CM

## 2013-07-26 ENCOUNTER — Emergency Department (HOSPITAL_COMMUNITY)
Admission: EM | Admit: 2013-07-26 | Discharge: 2013-07-26 | Disposition: A | Discharge status: Home / Self Care | Payer: BC Managed Care – PPO | Source: Home / Self Care | Attending: Emergency Medicine | Admitting: Emergency Medicine

## 2013-07-26 ENCOUNTER — Encounter (HOSPITAL_COMMUNITY): Payer: Self-pay | Admitting: Emergency Medicine

## 2013-07-26 DIAGNOSIS — M25512 Pain in left shoulder: Secondary | ICD-10-CM

## 2013-07-26 DIAGNOSIS — L301 Dyshidrosis [pompholyx]: Secondary | ICD-10-CM

## 2013-07-26 DIAGNOSIS — M25511 Pain in right shoulder: Secondary | ICD-10-CM

## 2013-07-26 MED ORDER — BETAMETHASONE DIPROPIONATE AUG 0.05 % EX CREA: TOPICAL_CREAM | Freq: Two times a day (BID) | CUTANEOUS | Status: DC

## 2013-07-26 MED ORDER — HYDROCODONE-ACETAMINOPHEN 5-325 MG PO TABS: ORAL_TABLET | ORAL | Status: DC

## 2013-07-26 NOTE — Discharge Instructions (Signed)
Most shoulder pain is caused by soft tissue problems rather than arthritis.  Rotator cuff tendonitis or tendonosis, rotator cuff tears, impingement syndrome and cartilege (labrum tears) are a few of the common causes of shoulder pain.  Fortunately, most of these can be treated with conservative measures as outlined below.  Do not do the following:  Doing any work with the arms above shoulder level (especially lifting) until the pain has subsided.  Sleeping on the affected side.  Especially avoid sleeping with your arm under your head or your pillow.  This is a habit that is hard to break.  Some people have to pin the arm of their pajamas to the chest area to prevent this.  Do the following:  Do the shoulder exercises below twice daily followed by ice for 10 minutes.  If no better in 1 month, follow up here, with your primary care doctor, or with an orthopedist.  Use of over the counter pain meds can be of help.  Tylenol (or acetaminophen) is the safest to use.  It often helps to take this regularly.  You can take up to 2 325 mg tablets 5 times daily, but it best to start out much lower that that, perhaps 2 325 mg tablets twice daily, then increase from there. People who are on the blood thinner warfarin have to be careful about taking high doses of Tylenol.  For people who are able to tolerate them, ibuprofen and Aleve can also help with the pain.  You should discuss these agents with your physician before taking them.  People with chronic kidney disease, hypertension, peptic ulcer disease, and reflux can suffer adverse side effects. They should not be taken with warfarin. The maximum dosage of ibuprofen is 800 mg 3 times daily with meals.  The maximum dosage of Aleve is 2 and 1/2 tablets twice daily with food, but again, start out low and gradually increase the dose until adequate pain relief is achieved. Ibuprofen and Aleve should always be taken with food.       Eczema Eczema, also called  atopic dermatitis, is a skin disorder that causes inflammation of the skin. It causes a red rash and dry, scaly skin. The skin becomes very itchy. Eczema is generally worse during the cooler winter months and often improves with the warmth of summer. Eczema usually starts showing signs in infancy. Some children outgrow eczema, but it may last through adulthood.  CAUSES  The exact cause of eczema is not known, but it appears to run in families. People with eczema often have a family history of eczema, allergies, asthma, or hay fever. Eczema is not contagious. Flare-ups of the condition may be caused by:   Contact with something you are sensitive or allergic to.   Stress. SIGNS AND SYMPTOMS  Dry, scaly skin.   Red, itchy rash.   Itchiness. This may occur before the skin rash and may be very intense.  DIAGNOSIS  The diagnosis of eczema is usually made based on symptoms and medical history. TREATMENT  Eczema cannot be cured, but symptoms usually can be controlled with treatment and other strategies. A treatment plan might include:  Controlling the itching and scratching.   Use over-the-counter antihistamines as directed for itching. This is especially useful at night when the itching tends to be worse.   Use over-the-counter steroid creams as directed for itching.   Avoid scratching. Scratching makes the rash and itching worse. It may also result in a skin infection (impetigo) due  to a break in the skin caused by scratching.   Keeping the skin well moisturized with creams every day. This will seal in moisture and help prevent dryness. Lotions that contain alcohol and water should be avoided because they can dry the skin.   Limiting exposure to things that you are sensitive or allergic to (allergens).   Recognizing situations that cause stress.   Developing a plan to manage stress.  HOME CARE INSTRUCTIONS   Only take over-the-counter or prescription medicines as directed  by your health care provider.   Do not use anything on the skin without checking with your health care provider.   Keep baths or showers short (5 minutes) in warm (not hot) water. Use mild cleansers for bathing. These should be unscented. You may add nonperfumed bath oil to the bath water. It is best to avoid soap and bubble bath.   Immediately after a bath or shower, when the skin is still damp, apply a moisturizing ointment to the entire body. This ointment should be a petroleum ointment. This will seal in moisture and help prevent dryness. The thicker the ointment, the better. These should be unscented.   Keep fingernails cut short. Children with eczema may need to wear soft gloves or mittens at night after applying an ointment.   Dress in clothes made of cotton or cotton blends. Dress lightly, because heat increases itching.   A child with eczema should stay away from anyone with fever blisters or cold sores. The virus that causes fever blisters (herpes simplex) can cause a serious skin infection in children with eczema. SEEK MEDICAL CARE IF:   Your itching interferes with sleep.   Your rash gets worse or is not better within 1 week after starting treatment.   You see pus or soft yellow scabs in the rash area.   You have a fever.   You have a rash flare-up after contact with someone who has fever blisters.  Document Released: 05/27/2000 Document Revised: 03/20/2013 Document Reviewed: 12/31/2012 [facility] Patient Information 2014 [place].

## 2013-07-26 NOTE — ED Provider Notes (Signed)
Chief Complaint    Chief Complaint  Patient presents with  . Shoulder Pain    History of Present Illness      [name] is a 58 year old female who presents tonight with a one-week history of a rash on both of her hands. The rash is peeling, her skin is cracking and bleeding, has HTN painful. She has had some rash on her scalp and also her lips have been swollen, dry, and peeling. She denies any rash elsewhere on her body. She's had no specific exposure to any allergens or antigens. She has never had anything like this before. No new foods or medications. She denies any history of eczema. Her other complaint has been aching in both of her shoulders. This is been going on since last October, 5 months. She has seen a rheumatologist and had blood work done. She was diagnosed as having fibromyalgia. She also notes aching in her arms, back, and cramping in her hands and her legs. She is a diabetic her blood sugars have been up and down. She sees Dr. Criss Rosales for her primary care. She has seen Dr. Criss Rosales for this rash and she prescribed moisturizers.  Review of Systems   Other than as noted above, the patient denies any of the following symptoms: Systemic:  No fever, chills, or myalgias. ENT:  No nasal congestion, rhinorrhea, sore throat, swelling of lips, tongue or throat. Resp:  No cough, wheezing, or shortness of breath.  [place]    Past medical history, family history, social history, meds, and allergies were reviewed. She has no medication allergies. She has diabetes and hypertension. She takes metformin, lisinopril, insulin, and tramadol.  Physical Exam     Vital signs:  BP 146/90  Pulse 102  Temp(Src) 97.8 F (36.6 C) (Oral)  Resp 18  SpO2 99%  LMP 06/13/2000 Gen:  Alert, oriented, in no distress. ENT:  Pharynx clear, no intraoral lesions, moist mucous membranes. Lungs:  Clear to auscultation. Skin:  The skin on the palms of both of her hands was peeling, dry, red, and cracked.  There is no peeling or rash over the dorsum of the hands. Skin was otherwise clear. Scalp is normal. Lips are erythematous, slightly swollen, and cracked. Extremities: Both shoulders are painful to palpation and have almost 0 range of motion. Joint survey is otherwise unremarkable.  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 A diagnosis of Shoulder pain, bilateral was also pertinent to this visit.  I'm not certain whether these symptoms are connected. The rash in the hands appears to be typical dyshidrotic or hand eczema. There is no obvious precipitating allergen or antigen. 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  Plan     1.  Meds:  The following meds were prescribed:   Discharge Medication List as of 07/26/2013  4:51 PM    START taking these medications   Details  augmented betamethasone dipropionate (DIPROLENE AF) 0.05 % cream Apply topically 2 (two) times daily., Starting 07/26/2013, Until Discontinued, Normal    HYDROcodone-acetaminophen (NORCO/VICODIN) 5-325 MG per tablet 1 to 2 tabs every 4 to 6 hours as needed for pain., Print        2.  Patient Education/Counseling:  The patient was given appropriate handouts, self care instructions, and instructed in symptomatic relief.  Suggested application of the Diprolene under occlusion at bedtime.  3.  Follow up:  The patient was told to  follow up here if no better in 3 to 4 days, or sooner if becoming worse in any way, and given some red flag symptoms such as worsening rash, fever, or difficulty breathing which would prompt immediate return.  Follow up here if necessary. Suggested following up with Dr. Criss Rosales, and also referral to a [facility] for rheumatological evaluation.      Harden Mo, MD 07/26/13 [phone]

## 2013-07-26 NOTE — ED Notes (Signed)
Pt  Has  Bilateral  Shoulder  Pain     Both  Shoulders        As  Well  As  Dry  Cracked   Hands               X  1  Week   Seen  By  pcp  4  Days  Ago  -  Pt  Reports    Sugars  Have  Been   Up and  Down  Recently                She  Has  Some  paiswelling  In the  Hands  As   Well

## 2013-08-14 ENCOUNTER — Emergency Department (HOSPITAL_COMMUNITY)
Admission: EM | Admit: 2013-08-14 | Discharge: 2013-08-14 | Disposition: A | Discharge status: Home / Self Care | Payer: BC Managed Care – PPO | Source: Home / Self Care | Attending: Family Medicine | Admitting: Family Medicine

## 2013-08-14 ENCOUNTER — Encounter (HOSPITAL_COMMUNITY): Payer: Self-pay | Admitting: Emergency Medicine

## 2013-08-14 DIAGNOSIS — N949 Unspecified condition associated with female genital organs and menstrual cycle: Secondary | ICD-10-CM

## 2013-08-14 DIAGNOSIS — R102 Pelvic and perineal pain: Secondary | ICD-10-CM

## 2013-08-14 DIAGNOSIS — N390 Urinary tract infection, site not specified: Secondary | ICD-10-CM

## 2013-08-14 LAB — POCT URINALYSIS DIP (DEVICE)
BILIRUBIN URINE: NEGATIVE
Glucose, UA: >=1000 mg/dL — AB
KETONES UR: NEGATIVE mg/dL
Nitrite: NEGATIVE
PROTEIN: 100 mg/dL — AB
SPECIFIC GRAVITY, URINE: 1.025 (ref 1.005–1.030)
Urobilinogen, UA: 1 mg/dL (ref 0.0–1.0)
pH: 6 (ref 5.0–8.0)

## 2013-08-14 MED ORDER — CEPHALEXIN 500 MG PO CAPS: 500.0000 mg | ORAL_CAPSULE | Freq: Four times a day (QID) | ORAL | Status: DC

## 2013-08-14 NOTE — ED Notes (Signed)
abdomnial pain off and on since October. Has had UA spotting , and is concerned for another UTI. Was just treated by her PCP for UTI, but now pain more diffuse. History of uterine fibroids. LNMP 2001. Had reportedly discussed w another provider the poss of having a hysterectomy at some point if she became symptomatic from fibroids in future

## 2013-08-14 NOTE — ED Notes (Signed)
Pt to go directly to [facility] ; Dr Juventino Slovak has spoke w "Almyra Free" , to advise of transfer of care. Call back number for lab issues verified

## 2013-08-14 NOTE — Discharge Instructions (Signed)
Take all of medicine as directed, drink lots of fluids, see your doctor if further problems. °

## 2013-08-14 NOTE — ED Provider Notes (Signed)
CSN: [id number]     Arrival date & time 08/14/13  1215 History   First MD Initiated Contact with Patient 08/14/13 1336     Chief Complaint  Patient presents with  . Abdominal Pain   (Consider location/radiation/quality/duration/timing/severity/associated sxs/prior Treatment) Patient is a 58 y.o. female presenting with female genitourinary complaint.  Female GU Problem This is a new problem. Episode onset: urinary sx for 1 week, vag spotting and pain since yest. The problem has been gradually worsening. Associated symptoms include abdominal pain. Pertinent negatives include no chest pain, no headaches and no shortness of breath. Associated symptoms comments: Seen by lmd 1wk ago given cipro but uti sx continue., vag spotting since yest, increasing pain..    Past Medical History  Diagnosis Date  . Diabetes mellitus   . Hypertension   . Heart palpitations   . Anxiety   . Depression   . Bronchitis   . GERD (gastroesophageal reflux disease)     hx of  . Arthritis     shoulder  . Hepatitis C   . Diabetic neuropathy   . Substance abuse     Cocaine in past.  Clean since 2002.  Marland Kitchen Atherosclerosis   . Cirrhosis of liver due to hepatitis C   . Anemia   . Broken ankle     left   Past Surgical History  Procedure Laterality Date  . Tonsillectomy    . Tubal ligation    . Cesarean section  1987  . Transurethral resection of bladder tumor N/A 09/07/2012    Procedure: TRANSURETHRAL RESECTION OF BLADDER TUMOR (TURBT);  Surgeon: Alexis Frock, MD;  Location: [facility];  Service: Urology;  Laterality: N/A;  Also Exam Under Anesthesia and Cervical Biopsy   . Cystoscopy w/ retrogrades Bilateral 09/07/2012    Procedure: CYSTOSCOPY WITH RETROGRADE PYELOGRAM;  Surgeon: Alexis Frock, MD;  Location: [facility];  Service: Urology;  Laterality: Bilateral;  . Cervical biopsy     Family History  Problem Relation Age of Onset  . Diabetes Mother   . Congestive Heart Failure Mother   . Hypertension Mother     . Diabetes Brother   . Hypertension Brother     x 2  . Seizures Brother     had brain surgery-no issues since  . Graves' disease Brother   . Liver disease Brother     x 2, Hep C   History  Substance Use Topics  . Smoking status: Former Smoker -- 0.25 packs/day for 20 years    Types: Cigarettes    Quit date: 06/13/2000  . Smokeless tobacco: Never Used  . Alcohol Use: No     Comment: used to drink but none since 2002   OB History   Grav Para Term Preterm Abortions TAB SAB Ect Mult Living   5 2   3 1 1 1 1 4      Review of Systems  Constitutional: Negative.  Negative for fever.  Respiratory: Negative for shortness of breath.   Cardiovascular: Negative for chest pain.  Gastrointestinal: Positive for abdominal pain. Negative for nausea and vomiting.  Genitourinary: Positive for dysuria, urgency, vaginal bleeding, menstrual problem and pelvic pain. Negative for flank pain.  Neurological: Negative for headaches.    Allergies  Review of patient's allergies indicates no known allergies.  Home Medications   Current Outpatient Rx  Name  Route  Sig  Dispense  Refill  . augmented betamethasone dipropionate (DIPROLENE AF) 0.05 % cream   Topical   Apply topically 2 (  two) times daily.   50 g   3   . cephALEXin (KEFLEX) 500 MG capsule   Oral   Take 1 capsule (500 mg total) by mouth 4 (four) times daily. Take all of medicine and drink lots of fluids   20 capsule   0   . HYDROcodone-acetaminophen (NORCO/VICODIN) 5-325 MG per tablet      1 to 2 tabs every 4 to 6 hours as needed for pain.   30 tablet   0   . insulin glargine (LANTUS) 100 UNIT/ML injection   Subcutaneous   Inject 30 Units into the skin at bedtime.         . insulin regular (NOVOLIN R,HUMULIN R) 100 units/mL injection   Subcutaneous   Inject 5-20 Units into the skin daily as needed (per sliding scale). 200-220=5units if over 250 she does 20 units.Not a set rate she does this on her own.         .  lisinopril (PRINIVIL,ZESTRIL) 10 MG tablet   Oral   Take 10 mg by mouth daily.         . metFORMIN (GLUCOPHAGE) 1000 MG tablet   Oral   Take 1,000 mg by mouth 2 (two) times daily with a meal.         . Multiple Vitamin (MULTIVITAMIN WITH MINERALS) TABS   Oral   Take 1 tablet by mouth daily.         . Simeprevir Sodium (OLYSIO) 150 MG CAPS   Oral   Take by mouth daily.         . Sofosbuvir (SOVALDI) 400 MG TABS   Oral   Take by mouth daily.         . traMADol (ULTRAM) 50 MG tablet   Oral   Take 2 tablets (100 mg total) by mouth every 8 (eight) hours as needed for pain.   30 tablet   0    BP 122/73  Pulse 94  Temp(Src) 98.3 F (36.8 C) (Oral)  Resp 16  SpO2 100%  LMP 06/13/2000 Physical Exam  Nursing note and vitals reviewed. Constitutional: She is oriented to person, place, and time. She appears well-developed and well-nourished. She appears distressed.  Abdominal: Soft. Bowel sounds are normal. She exhibits distension. She exhibits no mass. There is no hepatosplenomegaly. There is tenderness in the suprapubic area. There is no rigidity, no rebound, no guarding, no CVA tenderness, no tenderness at McBurney's point and negative Murphy's sign.    Neurological: She is alert and oriented to person, place, and time.  Skin: Skin is warm and dry.    ED Course  Procedures (including critical care time) Labs Review Labs Reviewed  POCT URINALYSIS DIP (DEVICE) - Abnormal; Notable for the following:    Glucose, UA >=1000 (*)    Hgb urine dipstick LARGE (*)    Protein, ur 100 (*)    Leukocytes, UA SMALL (*)    All other components within normal limits  URINE CULTURE   Imaging Review No results found. U/a abnl.  MDM   1. Acute pelvic pain, female   2. UTI (lower urinary tract infection)    Sent to [facility] , discussed with Almyra Free -NP.   Billy Fischer, MD 08/14/13 1359

## 2013-08-15 ENCOUNTER — Telehealth (HOSPITAL_COMMUNITY): Payer: Self-pay | Admitting: *Deleted

## 2013-08-15 LAB — URINE CULTURE: Colony Count: 25000

## 2013-08-15 NOTE — ED Notes (Signed)
Pt. had called and talked to Ninfa Linden RN yesterday. She said she called [facility] Rheumatology Dept. and was told we had to do the referral. Izora Gala asked me to ask Dr. Jake Michaelis today.  Discussed with Dr. Jake Michaelis.  He said Dr. Criss Rosales is her PCP and she can ask her to do it.  I called pt. back and told her this.  She got irate and started yelling at me.   She wanted to know why Dr. Jake Michaelis did not tell her this when she was here, because it would have saved her a lot of time.  I said, I was telling her the instructions he told me to tell her and the only thing I can do is ask him again.   Discussed with Dr. Jake Michaelis again and he said he would do it.  He said when he called [facility], the pt. already has an appointment 5/4 @ 0830 and he will fax her record to them.  I called pt. and told her the referral was done and told her that they already had her appointment. She said, she did not know about the appointment. Roselyn Meier 08/15/2013

## 2013-09-23 ENCOUNTER — Telehealth: Payer: Self-pay | Admitting: Gastroenterology

## 2013-09-24 NOTE — Telephone Encounter (Signed)
Per last office note : She has decided to stick with [facility] and will plan to go ahead with her EGD and colonoscopy there. Left message on machine to call back

## 2013-09-25 NOTE — Telephone Encounter (Signed)
Left message on machine to call back  

## 2013-09-25 NOTE — Telephone Encounter (Signed)
[name] has been advised that the pt is now being seen at [name]

## 2014-01-13 ENCOUNTER — Other Ambulatory Visit: Payer: Self-pay | Admitting: Orthopedic Surgery

## 2014-01-13 ENCOUNTER — Ambulatory Visit
Admission: RE | Admit: 2014-01-13 | Discharge: 2014-01-13 | Disposition: A | Discharge status: Home / Self Care | Payer: BC Managed Care – PPO | Source: Ambulatory Visit | Attending: Orthopedic Surgery | Admitting: Orthopedic Surgery

## 2014-01-13 DIAGNOSIS — M25511 Pain in right shoulder: Secondary | ICD-10-CM

## 2014-01-13 DIAGNOSIS — M25512 Pain in left shoulder: Principal | ICD-10-CM

## 2014-03-24 ENCOUNTER — Encounter (HOSPITAL_COMMUNITY): Payer: Self-pay | Admitting: Emergency Medicine

## 2014-03-24 ENCOUNTER — Emergency Department (HOSPITAL_COMMUNITY): Payer: BC Managed Care – PPO

## 2014-03-24 ENCOUNTER — Emergency Department (HOSPITAL_COMMUNITY)
Admission: EM | Admit: 2014-03-24 | Discharge: 2014-03-24 | Disposition: A | Discharge status: Home / Self Care | Payer: BC Managed Care – PPO | Attending: Emergency Medicine | Admitting: Emergency Medicine

## 2014-03-24 DIAGNOSIS — Z8739 Personal history of other diseases of the musculoskeletal system and connective tissue: Secondary | ICD-10-CM | POA: Insufficient documentation

## 2014-03-24 DIAGNOSIS — I10 Essential (primary) hypertension: Secondary | ICD-10-CM | POA: Insufficient documentation

## 2014-03-24 DIAGNOSIS — Z862 Personal history of diseases of the blood and blood-forming organs and certain disorders involving the immune mechanism: Secondary | ICD-10-CM | POA: Insufficient documentation

## 2014-03-24 DIAGNOSIS — Z87891 Personal history of nicotine dependence: Secondary | ICD-10-CM | POA: Diagnosis not present

## 2014-03-24 DIAGNOSIS — F329 Major depressive disorder, single episode, unspecified: Secondary | ICD-10-CM | POA: Diagnosis not present

## 2014-03-24 DIAGNOSIS — Z794 Long term (current) use of insulin: Secondary | ICD-10-CM | POA: Diagnosis not present

## 2014-03-24 DIAGNOSIS — Y92410 Unspecified street and highway as the place of occurrence of the external cause: Secondary | ICD-10-CM | POA: Insufficient documentation

## 2014-03-24 DIAGNOSIS — M542 Cervicalgia: Secondary | ICD-10-CM

## 2014-03-24 DIAGNOSIS — Z8709 Personal history of other diseases of the respiratory system: Secondary | ICD-10-CM | POA: Diagnosis not present

## 2014-03-24 DIAGNOSIS — F419 Anxiety disorder, unspecified: Secondary | ICD-10-CM | POA: Insufficient documentation

## 2014-03-24 DIAGNOSIS — Y9389 Activity, other specified: Secondary | ICD-10-CM | POA: Insufficient documentation

## 2014-03-24 DIAGNOSIS — E1140 Type 2 diabetes mellitus with diabetic neuropathy, unspecified: Secondary | ICD-10-CM | POA: Insufficient documentation

## 2014-03-24 DIAGNOSIS — S199XXA Unspecified injury of neck, initial encounter: Secondary | ICD-10-CM | POA: Insufficient documentation

## 2014-03-24 DIAGNOSIS — Z8619 Personal history of other infectious and parasitic diseases: Secondary | ICD-10-CM | POA: Insufficient documentation

## 2014-03-24 DIAGNOSIS — Z8719 Personal history of other diseases of the digestive system: Secondary | ICD-10-CM | POA: Insufficient documentation

## 2014-03-24 DIAGNOSIS — G44309 Post-traumatic headache, unspecified, not intractable: Secondary | ICD-10-CM

## 2014-03-24 LAB — CBG MONITORING, ED: Glucose-Capillary: 249 mg/dL — ABNORMAL HIGH (ref 70–99)

## 2014-03-24 MED ORDER — METHOCARBAMOL 500 MG PO TABS: 1000.0000 mg | ORAL_TABLET | Freq: Four times a day (QID) | ORAL | Status: AC | PRN

## 2014-03-24 MED ORDER — ACETAMINOPHEN 500 MG PO TABS
1000.0000 mg | ORAL_TABLET | Freq: Once | ORAL | Status: AC
  Administered 2014-03-24: 1000 mg via ORAL
  Filled 2014-03-24: qty 2
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
## 2014-03-24 NOTE — ED Provider Notes (Signed)
Medical screening examination/treatment/procedure(s) were performed by non-physician practitioner and as supervising physician I was immediately available for consultation/collaboration.   EKG Interpretation   Date/Time:  Monday March 24 2014 17:59:31 EDT Ventricular Rate:  105 PR Interval:  142 QRS Duration: 81 QT Interval:  343 QTC Calculation: 453 R Axis:   45 Text Interpretation:  Sinus tachycardia Multiple ventricular premature  complexes Low voltage, precordial leads Borderline T wave abnormalities No  significant change since last tracing Confirmed by Maryan Rued  MD, Loree Fee  [phone]) on 03/24/2014 6:51:12 PM        Blanchie Dessert, MD 03/24/14 2314
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
## 2014-03-24 NOTE — Discharge Instructions (Signed)
Please follow with your primary care doctor in the next 2 days for a check-up. They must obtain records for further management.   Do not hesitate to return to the Emergency Department for any new, worsening or concerning symptoms.   For pain control you may take up to 800mg  of Motrin (also known as ibuprofen). That is usually 4 over the counter pills,  3 times a day. Take with food to minimize stomach irritation.    For breakthrough pain you may take Robaxin. Do not drink alcohol, drive or operate heavy machinery when taking Robaxin.    Cervical Sprain A cervical sprain is when the tissues (ligaments) that hold the neck bones in place stretch or tear. HOME CARE   Put ice on the injured area.  Put ice in a plastic bag.  Place a towel between your skin and the bag.  Leave the ice on for 15-20 minutes, 3-4 times a day.  You may have been given a collar to wear. This collar keeps your neck from moving while you heal.  Do not take the collar off unless told by your doctor.  If you have long hair, keep it outside of the collar.  Ask your doctor before changing the position of your collar. You may need to change its position over time to make it more comfortable.  If you are allowed to take off the collar for cleaning or bathing, follow your doctor's instructions on how to do it safely.  Keep your collar clean by wiping it with mild soap and water. Dry it completely. If the collar has removable pads, remove them every 1-2 days to hand wash them with soap and water. Allow them to air dry. They should be dry before you wear them in the collar.  Do not drive while wearing the collar.  Only take medicine as told by your doctor.  Keep all doctor visits as told.  Keep all physical therapy visits as told.  Adjust your work station so that you have good posture while you work.  Avoid positions and activities that make your problems worse.  Warm up and stretch before being active. GET  HELP IF:  Your pain is not controlled with medicine.  You cannot take less pain medicine over time as planned.  Your activity level does not improve as expected. GET HELP RIGHT AWAY IF:   You are bleeding.  Your stomach is upset.  You have an allergic reaction to your medicine.  You develop new problems that you cannot explain.  You lose feeling (become numb) or you cannot move any part of your body (paralysis).  You have tingling or weakness in any part of your body.  Your symptoms get worse. Symptoms include:  Pain, soreness, stiffness, puffiness (swelling), or a burning feeling in your neck.  Pain when your neck is touched.  Shoulder or upper back pain.  Limited ability to move your neck.  Headache.  Dizziness.  Your hands or arms feel week, lose feeling, or tingle.  Muscle spasms.  Difficulty swallowing or chewing. MAKE SURE YOU:   Understand these instructions.  Will watch your condition.  Will get help right away if you are not doing well or get worse. Document Released: 11/16/2007 Document Revised: 01/30/2013 Document Reviewed: 12/05/2012 [facility] Patient Information 2015 [place], [place]. This information is not intended to replace advice given to you by your health care provider. Make sure you discuss any questions you have with your health care provider.

## 2014-03-31 ENCOUNTER — Other Ambulatory Visit: Payer: Self-pay | Admitting: Nurse Practitioner

## 2014-03-31 DIAGNOSIS — C220 Liver cell carcinoma: Secondary | ICD-10-CM

## 2014-04-14 ENCOUNTER — Encounter (HOSPITAL_COMMUNITY): Payer: Self-pay | Admitting: Emergency Medicine

## 2014-05-07 ENCOUNTER — Other Ambulatory Visit: Payer: Self-pay

## 2014-05-29 ENCOUNTER — Telehealth: Payer: Self-pay | Admitting: *Deleted

## 2014-05-29 NOTE — Telephone Encounter (Signed)
Please make sure my office notes, last MMG (from several years ago), and Pap are sent.  CC: starla to watch for phone call.

## 2014-05-29 NOTE — Telephone Encounter (Signed)
Call received from Dr.V. At [facility].  Patient admitted at their facility for possible liver transplant and they are requesting records from previous pap and MMG. Dr Sabra Heck personally spoke with Dr Clayton Bibles. regarding patient limited (2) visits with our office.  They will call back with fax number to have records faxed to their facility.

## 2014-05-30 NOTE — Telephone Encounter (Addendum)
As of 05/30/14, we have not heard back from facility with the fax number. Records are in the drawer, per Dr. Sabra Heck, for faxing when we are contacted with the necessary information. We need the doctor's name, the phone number and the fax number.  Cc: Gay Filler for [employer].

## 2014-06-16 NOTE — Telephone Encounter (Signed)
[name], did anyone ever call for these records?

## 2014-06-19 NOTE — Telephone Encounter (Signed)
Thank you Starla.  Routing to provider for final review. Patient agreeable to disposition. Will close encounter

## 2014-06-19 NOTE — Telephone Encounter (Signed)
Yes, they were sent on 06/11/14.

## 2014-07-14 DEATH — deceased

## 2014-12-08 ENCOUNTER — Other Ambulatory Visit: Payer: Self-pay

## 2016-10-11 DEATH — deceased
